# Patient Record
Sex: Male | Born: 1980 | Race: White | Hispanic: No | Marital: Single | Smoking: Never smoker
Health system: Southern US, Community
[De-identification: ages and names within clinical notes are randomized; demographics above are authoritative.]

## PROBLEM LIST (undated history)

## (undated) DIAGNOSIS — K219 Gastro-esophageal reflux disease without esophagitis: Secondary | ICD-10-CM

## (undated) DIAGNOSIS — R569 Unspecified convulsions: Secondary | ICD-10-CM

## (undated) DIAGNOSIS — IMO0001 Reserved for inherently not codable concepts without codable children: Secondary | ICD-10-CM

## (undated) DIAGNOSIS — M549 Dorsalgia, unspecified: Secondary | ICD-10-CM

## (undated) DIAGNOSIS — K8020 Calculus of gallbladder without cholecystitis without obstruction: Secondary | ICD-10-CM

## (undated) DIAGNOSIS — I10 Essential (primary) hypertension: Secondary | ICD-10-CM

## (undated) DIAGNOSIS — Z8489 Family history of other specified conditions: Secondary | ICD-10-CM

## (undated) HISTORY — PX: OTHER SURGICAL HISTORY: SHX169

---

## 1999-05-25 ENCOUNTER — Encounter: Admission: RE | Admit: 1999-05-25 | Discharge: 1999-05-25 | Payer: Self-pay | Admitting: Family Medicine

## 1999-05-25 ENCOUNTER — Emergency Department (HOSPITAL_COMMUNITY): Admission: EM | Admit: 1999-05-25 | Discharge: 1999-05-25 | Payer: Self-pay | Admitting: Emergency Medicine

## 1999-05-25 ENCOUNTER — Encounter: Payer: Self-pay | Admitting: Family Medicine

## 1999-05-26 ENCOUNTER — Encounter: Payer: Self-pay | Admitting: Emergency Medicine

## 2001-11-12 ENCOUNTER — Emergency Department (HOSPITAL_COMMUNITY): Admission: EM | Admit: 2001-11-12 | Discharge: 2001-11-12 | Payer: Self-pay | Admitting: Emergency Medicine

## 2001-11-12 ENCOUNTER — Encounter: Payer: Self-pay | Admitting: Emergency Medicine

## 2013-01-11 DIAGNOSIS — I10 Essential (primary) hypertension: Secondary | ICD-10-CM | POA: Diagnosis not present

## 2013-01-11 DIAGNOSIS — Z23 Encounter for immunization: Secondary | ICD-10-CM | POA: Diagnosis not present

## 2013-03-26 ENCOUNTER — Emergency Department (HOSPITAL_BASED_OUTPATIENT_CLINIC_OR_DEPARTMENT_OTHER)
Admission: EM | Admit: 2013-03-26 | Discharge: 2013-03-26 | Disposition: A | Discharge status: Home / Self Care | Payer: Medicare Other | Attending: Emergency Medicine | Admitting: Emergency Medicine

## 2013-03-26 ENCOUNTER — Encounter (HOSPITAL_BASED_OUTPATIENT_CLINIC_OR_DEPARTMENT_OTHER): Payer: Self-pay | Admitting: Emergency Medicine

## 2013-03-26 DIAGNOSIS — M545 Low back pain, unspecified: Secondary | ICD-10-CM | POA: Diagnosis not present

## 2013-03-26 DIAGNOSIS — M549 Dorsalgia, unspecified: Secondary | ICD-10-CM

## 2013-03-26 DIAGNOSIS — Z79899 Other long term (current) drug therapy: Secondary | ICD-10-CM | POA: Insufficient documentation

## 2013-03-26 DIAGNOSIS — I10 Essential (primary) hypertension: Secondary | ICD-10-CM | POA: Insufficient documentation

## 2013-03-26 HISTORY — DX: Essential (primary) hypertension: I10

## 2013-03-26 HISTORY — DX: Dorsalgia, unspecified: M54.9

## 2013-03-26 MED ORDER — CYCLOBENZAPRINE HCL 5 MG PO TABS: 5.0000 mg | ORAL_TABLET | Freq: Two times a day (BID) | ORAL | Status: DC | PRN

## 2013-03-26 MED ORDER — OXYCODONE-ACETAMINOPHEN 5-325 MG PO TABS: 1.0000 | ORAL_TABLET | ORAL | Status: DC | PRN

## 2013-03-26 NOTE — ED Notes (Addendum)
Bent over and picked up 35 lb dog last evening and felt pain in low mid back.  Worse this morning. One prior incident of similar back pain. Pt took Percocet 5/325 at 6:30 am that he got from a family member.

## 2013-03-26 NOTE — ED Provider Notes (Signed)
Medical screening examination/treatment/procedure(s) were performed by non-physician practitioner and as supervising physician I was immediately available for consultation/collaboration.  EKG Interpretation   None        Doug SouSam Kasheem Toner, MD 03/26/13 [phone]

## 2013-03-26 NOTE — Discharge Instructions (Signed)
Back Pain, Adult Low back pain is very common. About 1 in 5 people have back pain.The cause of low back pain is rarely dangerous. The pain often gets better over time.About half of people with a sudden onset of back pain feel better in just 2 weeks. About 8 in 10 people feel better by 6 weeks.  CAUSES Some common causes of back pain include:  Strain of the muscles or ligaments supporting the spine.  Wear and tear (degeneration) of the spinal discs.  Arthritis.  Direct injury to the back. DIAGNOSIS Most of the time, the direct cause of low back pain is not known.However, back pain can be treated effectively even when the exact cause of the pain is unknown.Answering your caregiver's questions about your overall health and symptoms is one of the most accurate ways to make sure the cause of your pain is not dangerous. If your caregiver needs more information, he or she may order lab work or imaging tests (X-rays or MRIs).However, even if imaging tests show changes in your back, this usually does not require surgery. HOME CARE INSTRUCTIONS For many people, back pain returns.Since low back pain is rarely dangerous, it is often a condition that people can learn to manageon their own.   Remain active. It is stressful on the back to sit or stand in one place. Do not sit, drive, or stand in one place for more than 30 minutes at a time. Take short walks on level surfaces as soon as pain allows.Try to increase the length of time you walk each day.  Do not stay in bed.Resting more than 1 or 2 days can delay your recovery.  Do not avoid exercise or work.Your body is made to move.It is not dangerous to be active, even though your back may hurt.Your back will likely heal faster if you return to being active before your pain is gone.  Pay attention to your body when you bend and lift. Many people have less discomfortwhen lifting if they bend their knees, keep the load close to their bodies,and  avoid twisting. Often, the most comfortable positions are those that put less stress on your recovering back.  Find a comfortable position to sleep. Use a firm mattress and lie on your side with your knees slightly bent. If you lie on your back, put a pillow under your knees.  Only take over-the-counter or prescription medicines as directed by your caregiver. Over-the-counter medicines to reduce pain and inflammation are often the most helpful.Your caregiver may prescribe muscle relaxant drugs.These medicines help dull your pain so you can more quickly return to your normal activities and healthy exercise.  Put ice on the injured area.  Put ice in a plastic bag.  Place a towel between your skin and the bag.  Leave the ice on for 15-20 minutes, 03-04 times a day for the first 2 to 3 days. After that, ice and heat may be alternated to reduce pain and spasms.  Ask your caregiver about trying back exercises and gentle massage. This may be of some benefit.  Avoid feeling anxious or stressed.Stress increases muscle tension and can worsen back pain.It is important to recognize when you are anxious or stressed and learn ways to manage it.Exercise is a great option. SEEK MEDICAL CARE IF:  You have pain that is not relieved with rest or medicine.  You have pain that does not improve in 1 week.  You have new symptoms.  You are generally not feeling well. SEEK   IMMEDIATE MEDICAL CARE IF:   You have pain that radiates from your back into your legs.  You develop new bowel or bladder control problems.  You have unusual weakness or numbness in your arms or legs.  You develop nausea or vomiting.  You develop abdominal pain.  You feel faint. Document Released: 02/22/2005 Document Revised: 08/24/2011 Document Reviewed: 07/13/2010 [employer] Patient Information 2014 [employer].  

## 2013-03-26 NOTE — ED Provider Notes (Signed)
CSN: [id number]     Arrival date & time 03/26/13  [id number] History   First MD Initiated Contact with Patient 03/26/13 [phone]     Chief Complaint  Patient presents with  . Back Pain   (Consider location/radiation/quality/duration/timing/severity/associated sxs/prior Treatment) HPI Comments: Pt states that he started having pain after lifting a 35 lb dog:pt states that he took some of his dad oxycodone and it helped:pt denies numbness, weakness or dysuria  Patient is a 33 y.o. male presenting with back pain. The history is provided by the patient. No language interpreter was used.  Back Pain Location:  Lumbar spine Quality:  Aching Radiates to:  Does not radiate Pain severity:  Mild Pain is:  Same all the time Onset quality:  Sudden Duration:  1 day Timing:  Constant Progression:  Unchanged Relieved by:  Nothing Worsened by:  Nothing tried   Past Medical History  Diagnosis Date  . Hypertension   . Back pain    History reviewed. No pertinent past surgical history. No family history on file. History  Substance Use Topics  . Smoking status: Never Smoker   . Smokeless tobacco: Never Used  . Alcohol Use: No    Review of Systems  Constitutional: Negative.   Respiratory: Negative.   Cardiovascular: Negative.   Musculoskeletal: Positive for back pain.    Allergies  Review of patient's allergies indicates no known allergies.  Home Medications   Current Outpatient Rx  Name  Route  Sig  Dispense  Refill  . lisinopril (PRINIVIL,ZESTRIL) 20 MG tablet   Oral   Take 20 mg by mouth daily.         . cyclobenzaprine (FLEXERIL) 5 MG tablet   Oral   Take 1 tablet (5 mg total) by mouth 2 (two) times daily as needed for muscle spasms.   20 tablet   0   . oxyCODONE-acetaminophen (PERCOCET/ROXICET) 5-325 MG per tablet   Oral   Take 1-2 tablets by mouth every 4 (four) hours as needed for severe pain.   15 tablet   0    BP 132/74  Pulse 78  Temp(Src) 98.3 F (36.8 C) (Oral)   Resp 16  Ht 5\' 11"  (1.803 m)  Wt 220 lb (99.791 kg)  BMI 30.70 kg/m2  SpO2 96% Physical Exam  Nursing note and vitals reviewed. Constitutional: He is oriented to person, place, and time. He appears well-developed and well-nourished.  Cardiovascular: Normal rate and regular rhythm.   Pulmonary/Chest: Effort normal and breath sounds normal.  Musculoskeletal: Normal range of motion.  Lumbar paraspinal tenderness  Neurological: He is alert and oriented to person, place, and time. He exhibits normal muscle tone. Coordination normal.  Skin: Skin is warm and dry.    ED Course  Procedures (including critical care time) Labs Review Labs Reviewed - No data to display Imaging Review No results found.  EKG Interpretation   None       MDM   1. Back pain    Pt not having any deficits:will treat symptomatically and have follow up with Dr. Pearletha Forgehudnall as needed   Teressa LowerVrinda Kaleia Longhi, NP 03/26/13 [phone]

## 2013-04-06 ENCOUNTER — Encounter (HOSPITAL_BASED_OUTPATIENT_CLINIC_OR_DEPARTMENT_OTHER): Payer: Self-pay | Admitting: Emergency Medicine

## 2013-04-06 ENCOUNTER — Emergency Department (HOSPITAL_BASED_OUTPATIENT_CLINIC_OR_DEPARTMENT_OTHER): Payer: Medicare Other

## 2013-04-06 ENCOUNTER — Emergency Department (HOSPITAL_BASED_OUTPATIENT_CLINIC_OR_DEPARTMENT_OTHER)
Admission: EM | Admit: 2013-04-06 | Discharge: 2013-04-06 | Discharge status: Against Medical Advice | Payer: Medicare Other | Attending: Emergency Medicine | Admitting: Emergency Medicine

## 2013-04-06 DIAGNOSIS — J029 Acute pharyngitis, unspecified: Secondary | ICD-10-CM | POA: Insufficient documentation

## 2013-04-06 DIAGNOSIS — R070 Pain in throat: Secondary | ICD-10-CM | POA: Diagnosis not present

## 2013-04-06 DIAGNOSIS — R1310 Dysphagia, unspecified: Secondary | ICD-10-CM | POA: Insufficient documentation

## 2013-04-06 DIAGNOSIS — Z79899 Other long term (current) drug therapy: Secondary | ICD-10-CM | POA: Diagnosis not present

## 2013-04-06 DIAGNOSIS — J019 Acute sinusitis, unspecified: Secondary | ICD-10-CM | POA: Diagnosis not present

## 2013-04-06 DIAGNOSIS — Z8739 Personal history of other diseases of the musculoskeletal system and connective tissue: Secondary | ICD-10-CM | POA: Diagnosis not present

## 2013-04-06 DIAGNOSIS — I10 Essential (primary) hypertension: Secondary | ICD-10-CM | POA: Diagnosis not present

## 2013-04-06 LAB — BASIC METABOLIC PANEL
BUN: 7 mg/dL (ref 6–23)
CHLORIDE: 101 meq/L (ref 96–112)
CO2: 24 mEq/L (ref 19–32)
Calcium: 9.5 mg/dL (ref 8.4–10.5)
Creatinine, Ser: 0.9 mg/dL (ref 0.50–1.35)
GFR calc Af Amer: >90 mL/min (ref >90)
GFR calc non Af Amer: >90 mL/min (ref >90)
Glucose, Bld: 101 mg/dL — ABNORMAL HIGH (ref 70–99)
POTASSIUM: 4.2 meq/L (ref 3.7–5.3)
Sodium: 139 mEq/L (ref 137–147)

## 2013-04-06 LAB — CBC WITH DIFFERENTIAL/PLATELET
Basophils Absolute: 0 10*3/uL (ref 0.0–0.1)
Basophils Relative: 1 % (ref 0–1)
Eosinophils Absolute: 0.2 10*3/uL (ref 0.0–0.7)
Eosinophils Relative: 3 % (ref 0–5)
HCT: 43.1 % (ref 39.0–52.0)
HEMOGLOBIN: 14.7 g/dL (ref 13.0–17.0)
LYMPHS PCT: 27 % (ref 12–46)
Lymphs Abs: 2 10*3/uL (ref 0.7–4.0)
MCH: 30.4 pg (ref 26.0–34.0)
MCHC: 34.1 g/dL (ref 30.0–36.0)
MCV: 89.2 fL (ref 78.0–100.0)
MONO ABS: 0.6 10*3/uL (ref 0.1–1.0)
MONOS PCT: 8 % (ref 3–12)
NEUTROS ABS: 4.6 10*3/uL (ref 1.7–7.7)
NEUTROS PCT: 62 % (ref 43–77)
Platelets: 244 10*3/uL (ref 150–400)
RBC: 4.83 MIL/uL (ref 4.22–5.81)
RDW: 12.8 % (ref 11.5–15.5)
WBC: 7.3 10*3/uL (ref 4.0–10.5)

## 2013-04-06 MED ORDER — SODIUM CHLORIDE 0.9 % IV BOLUS (SEPSIS)
250.0000 mL | Freq: Once | INTRAVENOUS | Status: AC
Start: 2013-04-06 — End: 2013-04-06
  Administered 2013-04-06: 250 mL via INTRAVENOUS

## 2013-04-06 MED ORDER — SODIUM CHLORIDE 0.9 % IV SOLN: INTRAVENOUS | Status: DC

## 2013-04-06 MED ORDER — ONDANSETRON HCL 4 MG/2ML IJ SOLN
4.0000 mg | Freq: Once | INTRAMUSCULAR | Status: AC
  Administered 2013-04-06: 4 mg via INTRAVENOUS
  Filled 2013-04-06: qty 2

## 2013-04-06 NOTE — ED Notes (Signed)
Pt sts he does not want the CT and requested that his IV be taken out so he can leave. Pt aware that tests were ordered to rule out condition that could cause deterioration. Pt sts he just wants med for reflux. Mother with pt. IV removed. EDP notified.

## 2013-04-06 NOTE — ED Provider Notes (Signed)
CSN: [id number]     Arrival date & time 04/06/13  7829 History   First MD Initiated Contact with Patient 04/06/13 [phone]     Chief Complaint  Patient presents with  . Throat issue    (Consider location/radiation/quality/duration/timing/severity/associated sxs/prior Treatment) The history is provided by the patient and a parent.   33 year old male with complaint of something catching in his throat for the past 6 days. Patient states it feels like something is catching when he lays down causes discomfort makes it a low hard to swallow feels as if he has strep throat without pain. Patient's voice has no changes able to swallow okay able to drink liquids and eat food fine. Patient did have a 3 times of vomiting a few days ago after the onset of this discomfort or sensation. No history of similar problem.  Past Medical History  Diagnosis Date  . Hypertension   . Back pain    History reviewed. No pertinent past surgical history. No family history on file. History  Substance Use Topics  . Smoking status: Never Smoker   . Smokeless tobacco: Never Used  . Alcohol Use: No    Review of Systems  Constitutional: Negative for fever.  HENT: Positive for sore throat and trouble swallowing. Negative for congestion, drooling, ear pain and voice change.   Eyes: Negative for redness.  Respiratory: Negative for shortness of breath.   Cardiovascular: Negative for chest pain.  Gastrointestinal: Negative for abdominal pain.  Genitourinary: Negative for hematuria.  Skin: Negative for rash.  Neurological: Negative for facial asymmetry, speech difficulty and headaches.  Psychiatric/Behavioral: Negative for confusion.    Allergies  Review of patient's allergies indicates no known allergies.  Home Medications   Current Outpatient Rx  Name  Route  Sig  Dispense  Refill  . cyclobenzaprine (FLEXERIL) 5 MG tablet   Oral   Take 1 tablet (5 mg total) by mouth 2 (two) times daily as needed for muscle  spasms.   20 tablet   0   . lisinopril (PRINIVIL,ZESTRIL) 20 MG tablet   Oral   Take 20 mg by mouth daily.         Marland Kitchen oxyCODONE-acetaminophen (PERCOCET/ROXICET) 5-325 MG per tablet   Oral   Take 1-2 tablets by mouth every 4 (four) hours as needed for severe pain.   15 tablet   0    BP 118/75  Pulse 78  Temp(Src) 97.9 F (36.6 C) (Oral)  Resp 16  Ht 5\' 11"  (1.803 m)  Wt 220 lb (99.791 kg)  BMI 30.70 kg/m2  SpO2 99% Physical Exam  Nursing note and vitals reviewed. Constitutional: He is oriented to person, place, and time. He appears well-developed and well-nourished. No distress.  HENT:  Head: Normocephalic and atraumatic.  Mouth/Throat: Oropharynx is clear and moist. No oropharyngeal exudate.  No erythema no uvula swelling no cervical adenopathy  Eyes: Conjunctivae and EOM are normal. Pupils are equal, round, and reactive to light.  Neck: Normal range of motion. Neck supple. No tracheal deviation present. No thyromegaly present.  Pulmonary/Chest: Effort normal and breath sounds normal. No stridor. No respiratory distress.  Abdominal: Soft. Bowel sounds are normal. There is no tenderness.  Lymphadenopathy:    He has no cervical adenopathy.  Neurological: He is alert and oriented to person, place, and time. No cranial nerve deficit. He exhibits normal muscle tone. Coordination normal.  Skin: No rash noted.    ED Course  Procedures (including critical care time) Labs Review Labs Reviewed  CBC WITH DIFFERENTIAL  BASIC METABOLIC PANEL   Imaging Review No results found.  EKG Interpretation   None       MDM   1. Pain in throat    Patient the exam that was fairly normal no foreign body sensation no oropharynx abnormalities no erythema no exudate no uvula swelling. Offered the patient the CT scan of a soft tissue neck with contrast to further evaluate his complaint of 6 days. Patient not able to tolerate the CT scan refused it when his IV out wants to go home. Labs  are not back yet patient be discharged AMA.    Shelda JakesScott W. Oleva Koo, MD 04/06/13 [phone]

## 2013-04-06 NOTE — ED Notes (Signed)
Pt c/o "feeling like a catch in my throat" and worse when laying down x 5-6 days. Pt denies pain, able to swallow. No swelling to tongue or throat. Pt reports similar issue in past with vomiting.

## 2013-04-06 NOTE — ED Notes (Signed)
Patient went to CT and repeatedly sat up while Tech was attempting to begin scan.  The third time the pt jumped off of the table and refused the scan.  Back into the room, the pt stated he does not want any tests, he only wants medication for reflux and nothing else.  Dr. Deretha EmoryZackowski aware.

## 2013-12-13 DIAGNOSIS — I10 Essential (primary) hypertension: Secondary | ICD-10-CM | POA: Diagnosis not present

## 2013-12-13 DIAGNOSIS — Z23 Encounter for immunization: Secondary | ICD-10-CM | POA: Diagnosis not present

## 2013-12-13 DIAGNOSIS — F609 Personality disorder, unspecified: Secondary | ICD-10-CM | POA: Diagnosis not present

## 2013-12-13 DIAGNOSIS — K219 Gastro-esophageal reflux disease without esophagitis: Secondary | ICD-10-CM | POA: Diagnosis not present

## 2014-03-23 ENCOUNTER — Encounter (HOSPITAL_BASED_OUTPATIENT_CLINIC_OR_DEPARTMENT_OTHER): Payer: Self-pay | Admitting: Emergency Medicine

## 2014-03-23 ENCOUNTER — Emergency Department (HOSPITAL_BASED_OUTPATIENT_CLINIC_OR_DEPARTMENT_OTHER): Payer: Medicare Other

## 2014-03-23 ENCOUNTER — Emergency Department (HOSPITAL_BASED_OUTPATIENT_CLINIC_OR_DEPARTMENT_OTHER)
Admission: EM | Admit: 2014-03-23 | Discharge: 2014-03-23 | Disposition: A | Discharge status: Home / Self Care | Payer: Medicare Other | Attending: Emergency Medicine | Admitting: Emergency Medicine

## 2014-03-23 DIAGNOSIS — R4702 Dysphasia: Secondary | ICD-10-CM | POA: Diagnosis not present

## 2014-03-23 DIAGNOSIS — Z79899 Other long term (current) drug therapy: Secondary | ICD-10-CM | POA: Diagnosis not present

## 2014-03-23 DIAGNOSIS — R1110 Vomiting, unspecified: Secondary | ICD-10-CM | POA: Insufficient documentation

## 2014-03-23 DIAGNOSIS — R1310 Dysphagia, unspecified: Secondary | ICD-10-CM | POA: Diagnosis not present

## 2014-03-23 DIAGNOSIS — I10 Essential (primary) hypertension: Secondary | ICD-10-CM | POA: Insufficient documentation

## 2014-03-23 LAB — CBC WITH DIFFERENTIAL/PLATELET
BASOS ABS: 0.1 10*3/uL (ref 0.0–0.1)
BASOS PCT: 1 % (ref 0–1)
EOS ABS: 0.2 10*3/uL (ref 0.0–0.7)
Eosinophils Relative: 2 % (ref 0–5)
HCT: 47.1 % (ref 39.0–52.0)
Hemoglobin: 16.1 g/dL (ref 13.0–17.0)
Lymphocytes Relative: 21 % (ref 12–46)
Lymphs Abs: 1.9 10*3/uL (ref 0.7–4.0)
MCH: 29.8 pg (ref 26.0–34.0)
MCHC: 34.2 g/dL (ref 30.0–36.0)
MCV: 87.1 fL (ref 78.0–100.0)
MONOS PCT: 6 % (ref 3–12)
Monocytes Absolute: 0.6 10*3/uL (ref 0.1–1.0)
NEUTROS ABS: 6.2 10*3/uL (ref 1.7–7.7)
NEUTROS PCT: 70 % (ref 43–77)
PLATELETS: 239 10*3/uL (ref 150–400)
RBC: 5.41 MIL/uL (ref 4.22–5.81)
RDW: 13.1 % (ref 11.5–15.5)
WBC: 8.9 10*3/uL (ref 4.0–10.5)

## 2014-03-23 LAB — COMPREHENSIVE METABOLIC PANEL
ALBUMIN: 5.2 g/dL (ref 3.5–5.2)
ALK PHOS: 73 U/L (ref 39–117)
ALT: 22 U/L (ref 0–53)
ANION GAP: 7 (ref 5–15)
AST: 23 U/L (ref 0–37)
BUN: 10 mg/dL (ref 6–23)
CO2: 26 mmol/L (ref 19–32)
CREATININE: 0.96 mg/dL (ref 0.50–1.35)
Calcium: 9.6 mg/dL (ref 8.4–10.5)
Chloride: 103 mEq/L (ref 96–112)
Glucose, Bld: 94 mg/dL (ref 70–99)
Potassium: 3.5 mmol/L (ref 3.5–5.1)
Sodium: 136 mmol/L (ref 135–145)
Total Bilirubin: 0.5 mg/dL (ref 0.3–1.2)
Total Protein: 8.8 g/dL — ABNORMAL HIGH (ref 6.0–8.3)

## 2014-03-23 LAB — LIPASE, BLOOD: LIPASE: 38 U/L (ref 11–59)

## 2014-03-23 MED ORDER — PANTOPRAZOLE SODIUM 40 MG IV SOLR
40.0000 mg | Freq: Once | INTRAVENOUS | Status: AC
  Administered 2014-03-23: 40 mg via INTRAVENOUS
  Filled 2014-03-23: qty 40

## 2014-03-23 MED ORDER — SODIUM CHLORIDE 0.9 % IV BOLUS (SEPSIS)
1000.0000 mL | Freq: Once | INTRAVENOUS | Status: AC
Start: 2014-03-23 — End: 2014-03-23
  Administered 2014-03-23: 1000 mL via INTRAVENOUS

## 2014-03-23 MED ORDER — ONDANSETRON HCL 4 MG/2ML IJ SOLN
4.0000 mg | Freq: Once | INTRAMUSCULAR | Status: AC
  Administered 2014-03-23: 4 mg via INTRAVENOUS
  Filled 2014-03-23: qty 2

## 2014-03-23 MED ORDER — SODIUM CHLORIDE 0.9 % IV SOLN: INTRAVENOUS | Status: DC

## 2014-03-23 NOTE — Discharge Instructions (Signed)
Give [facility] a call for follow-up the phone number is [phone]. Return for any new or worse symptoms or the inability for food to go down. Recommend the liquid diet for the next 24 hours. Continue your Prilosec.

## 2014-03-23 NOTE — ED Notes (Signed)
PT present to ED with complaints of unable to swallow since yesterday.

## 2014-03-23 NOTE — ED Provider Notes (Signed)
CSN: [id number]     Arrival date & time 03/23/14  [id number] History   This chart was scribed for Vanetta MuldersScott Madell Heino, MD by Evon Slackerrance Branch, ED Scribe. This patient was seen in room MH07/MH07 and the patient's care was started at 7:35 PM.      Chief Complaint  Patient presents with  . Dysphagia   The history is provided by the patient. No language interpreter was used.   HPI Comments: Neil Griffith is a 34 y.o. male who presents to the Emergency Department complaining of recurrent difficulty swallowing onset 1 day prior. Pt states that when he laid down her felt the food "coming back up." Pt states drinking solids or water the food "comes back up" within 30-40 minutes. Denies any pain other than discomfort in back of his throat. Pt states that about ever 10 minutes he has to spit out saliva. Pt describes his symptoms as mucous sitting in the back of his throat. Pt states previously when he had similar symptoms they lasted for about 2 days. Denies nausea.    PCP Dr. Nicholos Johnseade   Past Medical History  Diagnosis Date  . Hypertension   . Back pain    History reviewed. No pertinent past surgical history. No family history on file. History  Substance Use Topics  . Smoking status: Never Smoker   . Smokeless tobacco: Never Used  . Alcohol Use: No    Review of Systems  Constitutional: Negative for fever and chills.  HENT: Positive for trouble swallowing. Negative for rhinorrhea and sore throat.   Eyes: Negative for visual disturbance.  Respiratory: Negative for cough and shortness of breath.   Cardiovascular: Negative for chest pain and leg swelling.  Gastrointestinal: Positive for vomiting. Negative for nausea, abdominal pain and diarrhea.  Genitourinary: Negative for dysuria.  Musculoskeletal: Negative for back pain and neck pain.  Skin: Negative for rash.  Neurological: Negative for headaches.  Psychiatric/Behavioral: Negative for confusion.  All other systems reviewed and are  negative.     Allergies  Review of patient's allergies indicates no known allergies.  Home Medications   Prior to Admission medications   Medication Sig Start Date End Date Taking? Authorizing Provider  cyclobenzaprine (FLEXERIL) 5 MG tablet Take 1 tablet (5 mg total) by mouth 2 (two) times daily as needed for muscle spasms. 03/26/13   Teressa LowerVrinda Pickering, NP  lisinopril (PRINIVIL,ZESTRIL) 20 MG tablet Take 20 mg by mouth daily.    Historical Provider, MD  oxyCODONE-acetaminophen (PERCOCET/ROXICET) 5-325 MG per tablet Take 1-2 tablets by mouth every 4 (four) hours as needed for severe pain. 03/26/13   Teressa LowerVrinda Pickering, NP   BP 136/76 mmHg  Pulse 66  Temp(Src) 98.1 F (36.7 C) (Oral)  Resp 18  Ht 5\' 11"  (1.803 m)  Wt 220 lb (99.791 kg)  BMI 30.70 kg/m2  SpO2 99%   Physical Exam  Constitutional: He is oriented to person, place, and time. He appears well-developed and well-nourished. No distress.  HENT:  Head: Normocephalic and atraumatic.  Eyes: Conjunctivae and EOM are normal.  Neck: Neck supple. No tracheal deviation present.  Cardiovascular: Normal rate and regular rhythm.   Pulmonary/Chest: Effort normal and breath sounds normal. No respiratory distress. He has no wheezes. He has no rales.  Abdominal: Soft. Bowel sounds are normal. There is no tenderness.  Musculoskeletal: Normal range of motion. He exhibits no edema.  Neurological: He is alert and oriented to person, place, and time. No cranial nerve deficit.  Skin: Skin is warm and  dry.  Psychiatric: He has a normal mood and affect. His behavior is normal.  Nursing note and vitals reviewed.   ED Course  Procedures (including critical care time) DIAGNOSTIC STUDIES: Oxygen Saturation is 99% on RA, normal by my interpretation.    COORDINATION OF CARE: 7:49 PM-Discussed treatment plan with pt at bedside and pt agreed to plan.     Labs Review Labs Reviewed  COMPREHENSIVE METABOLIC PANEL - Abnormal; Notable for the  following:    Total Protein 8.8 (*)    All other components within normal limits  CBC WITH DIFFERENTIAL  LIPASE, BLOOD   Results for orders placed or performed during the hospital encounter of 03/23/14  CBC with Differential  Result Value Ref Range   WBC 8.9 4.0 - 10.5 K/uL   RBC 5.41 4.22 - 5.81 MIL/uL   Hemoglobin 16.1 13.0 - 17.0 g/dL   HCT 16.1 09.6 - 04.5 %   MCV 87.1 78.0 - 100.0 fL   MCH 29.8 26.0 - 34.0 pg   MCHC 34.2 30.0 - 36.0 g/dL   RDW 40.9 81.1 - 91.4 %   Platelets 239 150 - 400 K/uL   Neutrophils Relative % 70 43 - 77 %   Neutro Abs 6.2 1.7 - 7.7 K/uL   Lymphocytes Relative 21 12 - 46 %   Lymphs Abs 1.9 0.7 - 4.0 K/uL   Monocytes Relative 6 3 - 12 %   Monocytes Absolute 0.6 0.1 - 1.0 K/uL   Eosinophils Relative 2 0 - 5 %   Eosinophils Absolute 0.2 0.0 - 0.7 K/uL   Basophils Relative 1 0 - 1 %   Basophils Absolute 0.1 0.0 - 0.1 K/uL  Comprehensive metabolic panel  Result Value Ref Range   Sodium 136 135 - 145 mmol/L   Potassium 3.5 3.5 - 5.1 mmol/L   Chloride 103 96 - 112 mEq/L   CO2 26 19 - 32 mmol/L   Glucose, Bld 94 70 - 99 mg/dL   BUN 10 6 - 23 mg/dL   Creatinine, Ser 7.82 0.50 - 1.35 mg/dL   Calcium 9.6 8.4 - 95.6 mg/dL   Total Protein 8.8 (H) 6.0 - 8.3 g/dL   Albumin 5.2 3.5 - 5.2 g/dL   AST 23 0 - 37 U/L   ALT 22 0 - 53 U/L   Alkaline Phosphatase 73 39 - 117 U/L   Total Bilirubin 0.5 0.3 - 1.2 mg/dL   GFR calc non Af Amer >90 >90 mL/min   GFR calc Af Amer >90 >90 mL/min   Anion gap 7 5 - 15  Lipase, blood  Result Value Ref Range   Lipase 38 11 - 59 U/L     Imaging Review Dg Chest 2 View  03/23/2014   CLINICAL DATA:  Difficulty swallowing beginning last yesterday.  EXAM: CHEST  2 VIEW  COMPARISON:  None.  FINDINGS: The heart size and mediastinal contours are within normal limits. Both lungs are clear. The visualized skeletal structures are unremarkable.  IMPRESSION: No active cardiopulmonary disease.   Electronically Signed   By: Charlett Nose  M.D.   On: 03/23/2014 20:44     EKG Interpretation None      MDM   Final diagnoses:  Dysphagia   Patient symptoms seem to be more consistent with perhaps the esophageal motility or gastric motility problem. No evidence of true food impaction. Patient able to handle liquids. Occasionally some will come back but not all. Patient without really getting food stuck. But seems  to regurgitate after many hours. Most likely needs upper endoscopy by GI. Referral to [facility] provided. Also patient has equal primary care to follow-up with. Patient is already on Prilosec recommend that they continue that. Chest x-ray negative for any significant findings. Ever function test are normal lipase is normal. No significant lab abnormalities.   I personally performed the services described in this documentation, which was scribed in my presence. The recorded information has been reviewed and is accurate.       Vanetta Mulders, MD 03/23/14 2225

## 2014-03-25 DIAGNOSIS — K219 Gastro-esophageal reflux disease without esophagitis: Secondary | ICD-10-CM | POA: Diagnosis not present

## 2014-06-15 ENCOUNTER — Encounter (HOSPITAL_COMMUNITY): Payer: Self-pay | Admitting: Emergency Medicine

## 2014-06-15 ENCOUNTER — Emergency Department (HOSPITAL_COMMUNITY)
Admission: EM | Admit: 2014-06-15 | Discharge: 2014-06-15 | Disposition: A | Discharge status: Home / Self Care | Payer: Medicare Other | Attending: Emergency Medicine | Admitting: Emergency Medicine

## 2014-06-15 ENCOUNTER — Emergency Department (HOSPITAL_COMMUNITY): Payer: Medicare Other

## 2014-06-15 DIAGNOSIS — Z79899 Other long term (current) drug therapy: Secondary | ICD-10-CM | POA: Diagnosis not present

## 2014-06-15 DIAGNOSIS — R1031 Right lower quadrant pain: Secondary | ICD-10-CM | POA: Diagnosis not present

## 2014-06-15 DIAGNOSIS — K2970 Gastritis, unspecified, without bleeding: Secondary | ICD-10-CM | POA: Diagnosis not present

## 2014-06-15 DIAGNOSIS — I10 Essential (primary) hypertension: Secondary | ICD-10-CM | POA: Diagnosis not present

## 2014-06-15 DIAGNOSIS — N2889 Other specified disorders of kidney and ureter: Secondary | ICD-10-CM | POA: Diagnosis not present

## 2014-06-15 DIAGNOSIS — R10811 Right upper quadrant abdominal tenderness: Secondary | ICD-10-CM | POA: Diagnosis not present

## 2014-06-15 LAB — COMPREHENSIVE METABOLIC PANEL
ALBUMIN: 4.5 g/dL (ref 3.5–5.2)
ALT: 28 U/L (ref 0–53)
AST: 44 U/L — AB (ref 0–37)
Alkaline Phosphatase: 69 U/L (ref 39–117)
Anion gap: 9 (ref 5–15)
BUN: 12 mg/dL (ref 6–23)
CALCIUM: 9.2 mg/dL (ref 8.4–10.5)
CO2: 24 mmol/L (ref 19–32)
Chloride: 99 mmol/L (ref 96–112)
Creatinine, Ser: 1.03 mg/dL (ref 0.50–1.35)
GFR calc non Af Amer: >90 mL/min (ref >90)
GLUCOSE: 113 mg/dL — AB (ref 70–99)
Potassium: 3.7 mmol/L (ref 3.5–5.1)
Sodium: 132 mmol/L — ABNORMAL LOW (ref 135–145)
TOTAL PROTEIN: 7.6 g/dL (ref 6.0–8.3)
Total Bilirubin: 0.8 mg/dL (ref 0.3–1.2)

## 2014-06-15 LAB — CBC WITH DIFFERENTIAL/PLATELET
Basophils Absolute: 0.1 10*3/uL (ref 0.0–0.1)
Basophils Relative: 1 % (ref 0–1)
EOS ABS: 0.2 10*3/uL (ref 0.0–0.7)
Eosinophils Relative: 2 % (ref 0–5)
HCT: 44.9 % (ref 39.0–52.0)
Hemoglobin: 15.1 g/dL (ref 13.0–17.0)
Lymphocytes Relative: 16 % (ref 12–46)
Lymphs Abs: 1.7 10*3/uL (ref 0.7–4.0)
MCH: 30.2 pg (ref 26.0–34.0)
MCHC: 33.6 g/dL (ref 30.0–36.0)
MCV: 89.8 fL (ref 78.0–100.0)
MONOS PCT: 6 % (ref 3–12)
Monocytes Absolute: 0.6 10*3/uL (ref 0.1–1.0)
Neutro Abs: 8.2 10*3/uL — ABNORMAL HIGH (ref 1.7–7.7)
Neutrophils Relative %: 75 % (ref 43–77)
Platelets: 222 10*3/uL (ref 150–400)
RBC: 5 MIL/uL (ref 4.22–5.81)
RDW: 13.5 % (ref 11.5–15.5)
WBC: 10.8 10*3/uL — AB (ref 4.0–10.5)

## 2014-06-15 LAB — URINALYSIS, ROUTINE W REFLEX MICROSCOPIC
Bilirubin Urine: NEGATIVE
GLUCOSE, UA: NEGATIVE mg/dL
HGB URINE DIPSTICK: NEGATIVE
KETONES UR: NEGATIVE mg/dL
LEUKOCYTES UA: NEGATIVE
Nitrite: NEGATIVE
PROTEIN: NEGATIVE mg/dL
Specific Gravity, Urine: 1.024 (ref 1.005–1.030)
UROBILINOGEN UA: 0.2 mg/dL (ref 0.0–1.0)
pH: 5.5 (ref 5.0–8.0)

## 2014-06-15 LAB — LIPASE, BLOOD: LIPASE: 35 U/L (ref 11–59)

## 2014-06-15 MED ORDER — IOHEXOL 300 MG/ML  SOLN
100.0000 mL | Freq: Once | INTRAMUSCULAR | Status: AC | PRN
  Administered 2014-06-15: 100 mL via INTRAVENOUS

## 2014-06-15 MED ORDER — IOHEXOL 300 MG/ML  SOLN
50.0000 mL | Freq: Once | INTRAMUSCULAR | Status: AC | PRN
  Administered 2014-06-15: 50 mL via ORAL

## 2014-06-15 NOTE — ED Notes (Signed)
Bed: [id number] Expected date:  Expected time:  Means of arrival:  Comments: EMS 48M RUQ abd pain

## 2014-06-15 NOTE — ED Notes (Signed)
Pt awoke with RUQ abdominal pain.  Pt states it began an hour ago.  Pt has no rebound pain but does possess his appendix.  Pt states pain is sharp in nature.

## 2014-06-15 NOTE — ED Provider Notes (Signed)
CSN: [id number]     Arrival date & time 06/15/14  0609 History   First MD Initiated Contact with Patient 06/15/14 0701     Chief Complaint  Patient presents with  . Abdominal Pain     (Consider location/radiation/quality/duration/timing/severity/associated sxs/prior Treatment) HPI Comments: Patient is a 34 year old male with history of hypertension and acid reflux, however no prior abdominal surgeries who presents with complaints of pain in his right mid abdomen which started yesterday evening. He denies any fevers or chills. He denies any diarrhea or constipation. He denies any urinary complaints.  Patient is a 34 y.o. male presenting with abdominal pain. The history is provided by the patient.  Abdominal Pain Pain location:  RLQ Pain quality: cramping   Pain radiates to:  Does not radiate Pain severity:  Moderate Onset quality:  Sudden Duration:  12 hours Timing:  Constant Progression:  Worsening Chronicity:  New Relieved by:  Nothing Worsened by:  Nothing tried Ineffective treatments:  None tried   Past Medical History  Diagnosis Date  . Hypertension   . Back pain    History reviewed. No pertinent past surgical history. History reviewed. No pertinent family history. History  Substance Use Topics  . Smoking status: Never Smoker   . Smokeless tobacco: Never Used  . Alcohol Use: No    Review of Systems  Gastrointestinal: Positive for abdominal pain.  All other systems reviewed and are negative.     Allergies  Review of patient's allergies indicates no known allergies.  Home Medications   Prior to Admission medications   Medication Sig Start Date End Date Taking? Authorizing Provider  lisinopril (PRINIVIL,ZESTRIL) 20 MG tablet Take 20 mg by mouth daily.   Yes Historical Provider, MD  omeprazole (PRILOSEC) 40 MG capsule Take 40 mg by mouth daily.   Yes Historical Provider, MD  cyclobenzaprine (FLEXERIL) 5 MG tablet Take 1 tablet (5 mg total) by mouth 2 (two)  times daily as needed for muscle spasms. Patient not taking: Reported on 06/15/2014 03/26/13   Teressa Lower, NP  oxyCODONE-acetaminophen (PERCOCET/ROXICET) 5-325 MG per tablet Take 1-2 tablets by mouth every 4 (four) hours as needed for severe pain. Patient not taking: Reported on 06/15/2014 03/26/13   Teressa Lower, NP   BP 120/65 mmHg  Pulse 66  Temp(Src) 98.1 F (36.7 C) (Oral)  Resp 18  Ht  (1.803 m)  Wt 205 lb (92.987 kg)  BMI 28.60 kg/m2  SpO2 98% Physical Exam  Constitutional: He is oriented to person, place, and time. He appears well-developed and well-nourished. No distress.  HENT:  Head: Normocephalic and atraumatic.  Neck: Normal range of motion. Neck supple.  Cardiovascular: Normal rate, regular rhythm and normal heart sounds.   No murmur heard. Pulmonary/Chest: Effort normal and breath sounds normal. No respiratory distress. He has no wheezes.  Abdominal: Soft. Bowel sounds are normal. He exhibits no distension. There is tenderness. There is no rebound and no guarding.  There is tenderness to palpation in the right lower quadrant  Musculoskeletal: Normal range of motion. He exhibits no edema.  Lymphadenopathy:    He has no cervical adenopathy.  Neurological: He is alert and oriented to person, place, and time.  Skin: Skin is warm and dry. He is not diaphoretic.  Nursing note and vitals reviewed.   ED Course  Procedures (including critical care time) Labs Review Labs Reviewed  COMPREHENSIVE METABOLIC PANEL  LIPASE, BLOOD  CBC WITH DIFFERENTIAL/PLATELET  URINALYSIS, ROUTINE W REFLEX MICROSCOPIC    Imaging Review  No results found.   EKG Interpretation None      MDM   Final diagnoses:  None    Workup reveals a slight white count, however CT scan does not suggest appendicitis. There is the finding of a dilated right intrarenal collecting system consistent with a recently passed stone. As the patient's pain resolved spontaneously while in the  emergency department, I suspect that this is the case. He will be discharged to home with instructions to return as needed for any problems.    Geoffery Lyonsouglas Gershom Brobeck, MD 06/15/14 [phone]

## 2014-06-15 NOTE — ED Notes (Signed)
Pt transported from home by EMS with c/o RUQ pain onset @0500 , denies n/v/d.

## 2014-06-15 NOTE — ED Notes (Signed)
Pt transported to CT ?

## 2014-06-15 NOTE — Discharge Instructions (Signed)
Follow-up with your primary Dr. if not improving in the next week, and return to the ER if your symptoms significantly worsen or change.   Abdominal Pain Many things can cause abdominal pain. Usually, abdominal pain is not caused by a disease and will improve without treatment. It can often be observed and treated at home. Your health care provider will do a physical exam and possibly order blood tests and X-rays to help determine the seriousness of your pain. However, in many cases, more time must pass before a clear cause of the pain can be found. Before that point, your health care provider may not know if you need more testing or further treatment. HOME CARE INSTRUCTIONS  Monitor your abdominal pain for any changes. The following actions may help to alleviate any discomfort you are experiencing:  Only take over-the-counter or prescription medicines as directed by your health care provider.  Do not take laxatives unless directed to do so by your health care provider.  Try a clear liquid diet (broth, tea, or water) as directed by your health care provider. Slowly move to a bland diet as tolerated. SEEK MEDICAL CARE IF:  You have unexplained abdominal pain.  You have abdominal pain associated with nausea or diarrhea.  You have pain when you urinate or have a bowel movement.  You experience abdominal pain that wakes you in the night.  You have abdominal pain that is worsened or improved by eating food.  You have abdominal pain that is worsened with eating fatty foods.  You have a fever. SEEK IMMEDIATE MEDICAL CARE IF:   Your pain does not go away within 2 hours.  You keep throwing up (vomiting).  Your pain is felt only in portions of the abdomen, such as the right side or the left lower portion of the abdomen.  You pass bloody or black tarry stools. MAKE SURE YOU:  Understand these instructions.   Will watch your condition.   Will get help right away if you are not doing  well or get worse.  Document Released: 12/02/2004 Document Revised: 02/27/2013 Document Reviewed: 11/01/2012 [place] Patient Information 2015 [place]. This information is not intended to replace advice given to you by your health care provider. Make sure you discuss any questions you have with your health care provider.

## 2014-07-11 DIAGNOSIS — Z0000 Encounter for general adult medical examination without abnormal findings: Secondary | ICD-10-CM | POA: Diagnosis not present

## 2014-07-11 DIAGNOSIS — K219 Gastro-esophageal reflux disease without esophagitis: Secondary | ICD-10-CM | POA: Diagnosis not present

## 2014-07-11 DIAGNOSIS — Z1389 Encounter for screening for other disorder: Secondary | ICD-10-CM | POA: Diagnosis not present

## 2014-07-11 DIAGNOSIS — E782 Mixed hyperlipidemia: Secondary | ICD-10-CM | POA: Diagnosis not present

## 2014-07-11 DIAGNOSIS — I10 Essential (primary) hypertension: Secondary | ICD-10-CM | POA: Diagnosis not present

## 2015-01-29 ENCOUNTER — Emergency Department (HOSPITAL_BASED_OUTPATIENT_CLINIC_OR_DEPARTMENT_OTHER)
Admission: EM | Admit: 2015-01-29 | Discharge: 2015-01-29 | Disposition: A | Discharge status: Home / Self Care | Payer: Medicare Other | Attending: Physician Assistant | Admitting: Physician Assistant

## 2015-01-29 ENCOUNTER — Emergency Department (HOSPITAL_BASED_OUTPATIENT_CLINIC_OR_DEPARTMENT_OTHER): Payer: Medicare Other

## 2015-01-29 ENCOUNTER — Encounter (HOSPITAL_BASED_OUTPATIENT_CLINIC_OR_DEPARTMENT_OTHER): Payer: Self-pay | Admitting: *Deleted

## 2015-01-29 DIAGNOSIS — K219 Gastro-esophageal reflux disease without esophagitis: Secondary | ICD-10-CM | POA: Diagnosis not present

## 2015-01-29 DIAGNOSIS — I10 Essential (primary) hypertension: Secondary | ICD-10-CM | POA: Insufficient documentation

## 2015-01-29 DIAGNOSIS — Z79899 Other long term (current) drug therapy: Secondary | ICD-10-CM | POA: Diagnosis not present

## 2015-01-29 DIAGNOSIS — K8020 Calculus of gallbladder without cholecystitis without obstruction: Secondary | ICD-10-CM | POA: Insufficient documentation

## 2015-01-29 DIAGNOSIS — R1011 Right upper quadrant pain: Secondary | ICD-10-CM

## 2015-01-29 HISTORY — DX: Gastro-esophageal reflux disease without esophagitis: K21.9

## 2015-01-29 HISTORY — DX: Reserved for inherently not codable concepts without codable children: IMO0001

## 2015-01-29 LAB — CBC WITH DIFFERENTIAL/PLATELET
BASOS ABS: 0.1 10*3/uL (ref 0.0–0.1)
Basophils Relative: 1 %
Eosinophils Absolute: 0.2 10*3/uL (ref 0.0–0.7)
Eosinophils Relative: 2 %
HCT: 43.3 % (ref 39.0–52.0)
HEMOGLOBIN: 14.8 g/dL (ref 13.0–17.0)
LYMPHS ABS: 1.5 10*3/uL (ref 0.7–4.0)
LYMPHS PCT: 17 %
MCH: 29.5 pg (ref 26.0–34.0)
MCHC: 34.2 g/dL (ref 30.0–36.0)
MCV: 86.4 fL (ref 78.0–100.0)
Monocytes Absolute: 0.5 10*3/uL (ref 0.1–1.0)
Monocytes Relative: 6 %
NEUTROS ABS: 6.2 10*3/uL (ref 1.7–7.7)
NEUTROS PCT: 74 %
Platelets: 237 10*3/uL (ref 150–400)
RBC: 5.01 MIL/uL (ref 4.22–5.81)
RDW: 12.9 % (ref 11.5–15.5)
WBC: 8.5 10*3/uL (ref 4.0–10.5)

## 2015-01-29 LAB — COMPREHENSIVE METABOLIC PANEL
ALT: 19 U/L (ref 17–63)
ANION GAP: 6 (ref 5–15)
AST: 18 U/L (ref 15–41)
Albumin: 4.7 g/dL (ref 3.5–5.0)
Alkaline Phosphatase: 65 U/L (ref 38–126)
BILIRUBIN TOTAL: 0.6 mg/dL (ref 0.3–1.2)
BUN: 11 mg/dL (ref 6–20)
CHLORIDE: 101 mmol/L (ref 101–111)
CO2: 29 mmol/L (ref 22–32)
Calcium: 9.8 mg/dL (ref 8.9–10.3)
Creatinine, Ser: 0.98 mg/dL (ref 0.61–1.24)
Glucose, Bld: 117 mg/dL — ABNORMAL HIGH (ref 65–99)
POTASSIUM: 3.9 mmol/L (ref 3.5–5.1)
Sodium: 136 mmol/L (ref 135–145)
TOTAL PROTEIN: 8 g/dL (ref 6.5–8.1)

## 2015-01-29 LAB — URINALYSIS, ROUTINE W REFLEX MICROSCOPIC
BILIRUBIN URINE: NEGATIVE
GLUCOSE, UA: NEGATIVE mg/dL
Hgb urine dipstick: NEGATIVE
KETONES UR: NEGATIVE mg/dL
LEUKOCYTES UA: NEGATIVE
Nitrite: NEGATIVE
PROTEIN: NEGATIVE mg/dL
Specific Gravity, Urine: 1.023 (ref 1.005–1.030)
pH: 5.5 (ref 5.0–8.0)

## 2015-01-29 LAB — LIPASE, BLOOD: Lipase: 40 U/L (ref 11–51)

## 2015-01-29 MED ORDER — OXYCODONE-ACETAMINOPHEN 5-325 MG PO TABS: 1.0000 | ORAL_TABLET | Freq: Four times a day (QID) | ORAL | Status: DC | PRN

## 2015-01-29 MED ORDER — SODIUM CHLORIDE 0.9 % IV BOLUS (SEPSIS)
1000.0000 mL | Freq: Once | INTRAVENOUS | Status: AC
  Administered 2015-01-29: 1000 mL via INTRAVENOUS

## 2015-01-29 MED ORDER — OXYCODONE-ACETAMINOPHEN 5-325 MG PO TABS
1.0000 | ORAL_TABLET | Freq: Once | ORAL | Status: AC
  Administered 2015-01-29: 1 via ORAL
  Filled 2015-01-29: qty 1

## 2015-01-29 MED ORDER — ONDANSETRON HCL 4 MG PO TABS: 4.0000 mg | ORAL_TABLET | Freq: Three times a day (TID) | ORAL | Status: DC | PRN

## 2015-01-29 MED ORDER — IBUPROFEN 800 MG PO TABS: 800.0000 mg | ORAL_TABLET | Freq: Three times a day (TID) | ORAL | Status: DC

## 2015-01-29 NOTE — ED Provider Notes (Addendum)
CSN: [id number]     Arrival date & time 01/29/15  0715 History   First MD Initiated Contact with Patient 01/29/15 0720     Chief Complaint  Patient presents with  . Abdominal Pain     (Consider location/radiation/quality/duration/timing/severity/associated sxs/prior Treatment) HPI patient is a 34 year old male with history of hypertension and reflux presenting today with right upper quadrant pain. Says it woke him up at 3:30 in the morning. Denies any nausea vomiting or diarrhea. Denies any fever. Has never had pain like this in the past. He says his both colicky and stays constant. There is no lower abdominal component to this. Patient did not eat anything different than usual. Patient is accompanied by his mother.  Past Medical History  Diagnosis Date  . Hypertension   . Reflux    History reviewed. No pertinent past surgical history. History reviewed. No pertinent family history. Social History  Substance Use Topics  . Smoking status: Never Smoker   . Smokeless tobacco: None  . Alcohol Use: None    Review of Systems  Constitutional: Negative for activity change.  HENT: Negative for congestion.   Respiratory: Negative for shortness of breath.   Cardiovascular: Negative for chest pain.  Gastrointestinal: Positive for abdominal pain. Negative for nausea, diarrhea and constipation.  Genitourinary: Negative for dysuria and difficulty urinating.  Psychiatric/Behavioral: Negative for agitation.      Allergies  Review of patient's allergies indicates no known allergies.  Home Medications   Prior to Admission medications   Medication Sig Start Date End Date Taking? Authorizing Provider  lisinopril (PRINIVIL,ZESTRIL) 20 MG tablet Take 20 mg by mouth daily.   Yes Historical Provider, MD  omeprazole (PRILOSEC) 20 MG capsule Take 20 mg by mouth daily.   Yes Historical Provider, MD   BP 124/79 mmHg  Pulse 74  Temp(Src) 98 F (36.7 C) (Oral)  Resp 16  Ht 6' (1.829 m)  Wt 205  lb (92.987 kg)  BMI 27.80 kg/m2  SpO2 100% Physical Exam  Constitutional: He is oriented to person, place, and time. He appears well-nourished.  HENT:  Head: Normocephalic.  Mouth/Throat: Oropharynx is clear and moist.  Eyes: Conjunctivae are normal.  Neck: No tracheal deviation present.  Cardiovascular: Normal rate.   Pulmonary/Chest: Effort normal. No stridor. No respiratory distress.  Abdominal: Soft. There is tenderness. There is no guarding.  Mild tenderness right upper quadrant.  Musculoskeletal: Normal range of motion. He exhibits no edema.  Neurological: He is oriented to person, place, and time. No cranial nerve deficit.  Skin: Skin is warm and dry. No rash noted. He is not diaphoretic.  Psychiatric: He has a normal mood and affect. His behavior is normal.  Nursing note and vitals reviewed.   ED Course  Procedures (including critical care time) Labs Review Labs Reviewed  COMPREHENSIVE METABOLIC PANEL - Abnormal; Notable for the following:    Glucose, Bld 117 (*)    All other components within normal limits  URINALYSIS, ROUTINE W REFLEX MICROSCOPIC (NOT AT [facility])  CBC WITH DIFFERENTIAL/PLATELET  LIPASE, BLOOD    Imaging Review [place] Abdomen Complete  01/29/2015  CLINICAL DATA:  Right upper quadrant pain radiating to back this morning. EXAM: ULTRASOUND ABDOMEN COMPLETE COMPARISON:  None. FINDINGS: Gallbladder: Numerous mobile gallstones within the gallbladder. No wall thickening. Negative sonographic Murphy's. Common bile duct: Diameter: Normal caliber proximally, 3 mm. Liver: No focal lesion identified. Within normal limits in parenchymal echogenicity. IVC: No abnormality visualized. Pancreas: Visualized portion unremarkable. Spleen: Size and appearance within normal  limits. Right Kidney: Length: 11.8 cm. Echogenicity within normal limits. No mass or hydronephrosis visualized. Left Kidney: Length: 11.2 cm. Echogenicity within normal limits. No mass or hydronephrosis  visualized. Abdominal aorta: No aneurysm visualized. Other findings: None. IMPRESSION: Cholelithiasis.  No sonographic evidence of acute cholecystitis. Electronically Signed   By: Charlett Nose M.D.   On: 01/29/2015 09:12   I have personally reviewed and evaluated these images and lab results as part of my medical decision-making.   EKG Interpretation None      MDM   Final diagnoses:  Right upper quadrant pain  Patietn is a previously healthy 34 year old male presenting with right upper quadrant pain. This started around 3:30 the morning. Patient has not had any nausea vomiting diarrhea. Most likely etiology is gallstones. We will do an ultrasound. Doubt infection given his normal vital signs, no fever, relatively benign exam.  We'll get labs to rule out pancreatitis, acute hepatitis, make sure that there is no signs of infection.    9:20 AM Patient has gallstones. No signs of infection. We'll give him pain control and nausea control and have him follow-up with a surgeon as an outpatient. He has had no vomiting, does not appear in acute distress and has very minimal pain.  Serinity Ware Randall An, MD 01/29/15 0920  Georgiann Neider Randall An, MD 01/29/15 [id number]

## 2015-01-29 NOTE — ED Notes (Signed)
MD at bedside. 

## 2015-01-29 NOTE — ED Notes (Signed)
Patient returns from Ultrasound. 

## 2015-01-29 NOTE — Discharge Instructions (Signed)
You were seen and you have gallstones today. There is no surrounding infection at this time. However if you develop a fever or increasing pain you should come back because you may need your gallbladder taken out immediately.  Currently, it is your safe to manage her symptoms at home and follow-up with a surgeon as an outpatient.   Biliary Colic Biliary colic is a pain in the upper abdomen. The pain:  Is usually felt on the right side of the abdomen, but it may also be felt in the center of the abdomen, just below the breastbone (sternum).  May spread back toward the right shoulder blade.  May be steady or irregular.  May be accompanied by nausea and vomiting. Most of the time, the pain goes away in 1-5 hours. After the most intense pain passes, the abdomen may continue to ache mildly for about 24 hours. Biliary colic is caused by a blockage in the bile duct. The bile duct is a pathway that carries bile--a liquid that helps to digest fats--from the gallbladder to the small intestine. Biliary colic usually occurs after eating, when the digestive system demands bile. The pain develops when muscle cells contract forcefully to try to move the blockage so that bile can get by. HOME CARE INSTRUCTIONS  Take medicines only as directed by your health care provider.  Drink enough fluid to keep your urine clear or pale yellow.  Avoid fatty, greasy, and fried foods. These kinds of foods increase your body's demand for bile.  Avoid any foods that make your pain worse.  Avoid overeating.  Avoid having a large meal after fasting. SEEK MEDICAL CARE IF:  You develop a fever.  Your pain gets worse.  You vomit.  You develop nausea that prevents you from eating and drinking. SEEK IMMEDIATE MEDICAL CARE IF:  You suddenly develop a fever and shaking chills.  You develop a yellowish discoloration (jaundice) of:  Skin.  Whites of the eyes.  Mucous membranes.  You have continuous or severe pain  that is not relieved with medicines.  You have nausea and vomiting that is not relieved with medicines.  You develop dizziness or you faint.   This information is not intended to replace advice given to you by your health care provider. Make sure you discuss any questions you have with your health care provider.   Document Released: 07/26/2005 Document Revised: 07/09/2014 Document Reviewed: 12/04/2013 Elsevier Interactive Patient Education [employer] Elsevier Inc.

## 2015-01-29 NOTE — ED Notes (Signed)
Patient transported to Ultrasound 

## 2015-01-29 NOTE — ED Notes (Signed)
Pt reports sudden onset of rlq pain radiating to right flank, onset at 3am, "it woke me up." per pt, pain waxes and wanes, but is constant. Denies any n/v/d or urinary symptoms.

## 2015-02-19 DIAGNOSIS — R1011 Right upper quadrant pain: Secondary | ICD-10-CM | POA: Diagnosis not present

## 2015-02-19 DIAGNOSIS — K8020 Calculus of gallbladder without cholecystitis without obstruction: Secondary | ICD-10-CM | POA: Diagnosis not present

## 2015-03-01 ENCOUNTER — Emergency Department (HOSPITAL_COMMUNITY): Payer: Medicare Other

## 2015-03-01 ENCOUNTER — Encounter (HOSPITAL_COMMUNITY): Payer: Self-pay

## 2015-03-01 ENCOUNTER — Emergency Department (HOSPITAL_COMMUNITY)
Admission: EM | Admit: 2015-03-01 | Discharge: 2015-03-01 | Disposition: A | Discharge status: Home / Self Care | Payer: Medicare Other | Attending: Emergency Medicine | Admitting: Emergency Medicine

## 2015-03-01 DIAGNOSIS — Z8719 Personal history of other diseases of the digestive system: Secondary | ICD-10-CM | POA: Diagnosis not present

## 2015-03-01 DIAGNOSIS — M549 Dorsalgia, unspecified: Secondary | ICD-10-CM | POA: Diagnosis not present

## 2015-03-01 DIAGNOSIS — K8020 Calculus of gallbladder without cholecystitis without obstruction: Secondary | ICD-10-CM | POA: Diagnosis not present

## 2015-03-01 DIAGNOSIS — R109 Unspecified abdominal pain: Secondary | ICD-10-CM | POA: Diagnosis present

## 2015-03-01 DIAGNOSIS — R1031 Right lower quadrant pain: Secondary | ICD-10-CM | POA: Diagnosis not present

## 2015-03-01 DIAGNOSIS — R1084 Generalized abdominal pain: Secondary | ICD-10-CM | POA: Diagnosis not present

## 2015-03-01 DIAGNOSIS — Z87442 Personal history of urinary calculi: Secondary | ICD-10-CM

## 2015-03-01 DIAGNOSIS — I10 Essential (primary) hypertension: Secondary | ICD-10-CM | POA: Insufficient documentation

## 2015-03-01 DIAGNOSIS — Z79899 Other long term (current) drug therapy: Secondary | ICD-10-CM | POA: Diagnosis not present

## 2015-03-01 DIAGNOSIS — R102 Pelvic and perineal pain: Secondary | ICD-10-CM | POA: Diagnosis not present

## 2015-03-01 HISTORY — DX: Calculus of gallbladder without cholecystitis without obstruction: K80.20

## 2015-03-01 LAB — URINALYSIS, ROUTINE W REFLEX MICROSCOPIC
GLUCOSE, UA: NEGATIVE mg/dL
Hgb urine dipstick: NEGATIVE
KETONES UR: NEGATIVE mg/dL
Leukocytes, UA: NEGATIVE
Nitrite: NEGATIVE
PH: 5.5 (ref 5.0–8.0)
Protein, ur: 30 mg/dL — AB
Specific Gravity, Urine: 1.025 (ref 1.005–1.030)

## 2015-03-01 LAB — COMPREHENSIVE METABOLIC PANEL
ALBUMIN: 4.5 g/dL (ref 3.5–5.0)
ALK PHOS: 61 U/L (ref 38–126)
ALT: 20 U/L (ref 17–63)
AST: 19 U/L (ref 15–41)
Anion gap: 9 (ref 5–15)
BUN: 9 mg/dL (ref 6–20)
CALCIUM: 9.1 mg/dL (ref 8.9–10.3)
CO2: 25 mmol/L (ref 22–32)
Chloride: 104 mmol/L (ref 101–111)
Creatinine, Ser: 1.02 mg/dL (ref 0.61–1.24)
GFR calc Af Amer: >60 mL/min (ref >60)
GFR calc non Af Amer: >60 mL/min (ref >60)
GLUCOSE: 120 mg/dL — AB (ref 65–99)
POTASSIUM: 3.3 mmol/L — AB (ref 3.5–5.1)
Sodium: 138 mmol/L (ref 135–145)
Total Bilirubin: 0.2 mg/dL — ABNORMAL LOW (ref 0.3–1.2)
Total Protein: 7.2 g/dL (ref 6.5–8.1)

## 2015-03-01 LAB — URINE MICROSCOPIC-ADD ON: RBC / HPF: NONE SEEN RBC/hpf (ref 0–5)

## 2015-03-01 LAB — CBC WITH DIFFERENTIAL/PLATELET
Basophils Absolute: 0 10*3/uL (ref 0.0–0.1)
Basophils Relative: 0 %
EOS ABS: 0.2 10*3/uL (ref 0.0–0.7)
Eosinophils Relative: 3 %
HCT: 41 % (ref 39.0–52.0)
Hemoglobin: 14 g/dL (ref 13.0–17.0)
Lymphocytes Relative: 16 %
Lymphs Abs: 1.2 10*3/uL (ref 0.7–4.0)
MCH: 30 pg (ref 26.0–34.0)
MCHC: 34.1 g/dL (ref 30.0–36.0)
MCV: 88 fL (ref 78.0–100.0)
MONOS PCT: 6 %
Monocytes Absolute: 0.5 10*3/uL (ref 0.1–1.0)
NEUTROS PCT: 75 %
Neutro Abs: 5.7 10*3/uL (ref 1.7–7.7)
Platelets: 209 10*3/uL (ref 150–400)
RBC: 4.66 MIL/uL (ref 4.22–5.81)
RDW: 12.9 % (ref 11.5–15.5)
WBC: 7.6 10*3/uL (ref 4.0–10.5)

## 2015-03-01 LAB — LIPASE, BLOOD: Lipase: 38 U/L (ref 11–51)

## 2015-03-01 MED ORDER — MORPHINE SULFATE (PF) 4 MG/ML IV SOLN
4.0000 mg | Freq: Once | INTRAVENOUS | Status: AC
  Administered 2015-03-01: 4 mg via INTRAVENOUS
  Filled 2015-03-01: qty 1

## 2015-03-01 MED ORDER — DOCUSATE SODIUM 100 MG PO CAPS: 100.0000 mg | ORAL_CAPSULE | Freq: Two times a day (BID) | ORAL | Status: DC

## 2015-03-01 MED ORDER — POLYETHYLENE GLYCOL 3350 17 G PO PACK: 17.0000 g | PACK | Freq: Every day | ORAL | Status: DC

## 2015-03-01 MED ORDER — DICYCLOMINE HCL 20 MG PO TABS: 20.0000 mg | ORAL_TABLET | Freq: Two times a day (BID) | ORAL | Status: DC | PRN

## 2015-03-01 MED ORDER — SODIUM CHLORIDE 0.9 % IV BOLUS (SEPSIS)
1000.0000 mL | Freq: Once | INTRAVENOUS | Status: AC
  Administered 2015-03-01: 1000 mL via INTRAVENOUS

## 2015-03-01 MED ORDER — KETOROLAC TROMETHAMINE 30 MG/ML IJ SOLN
30.0000 mg | Freq: Once | INTRAMUSCULAR | Status: AC
  Administered 2015-03-01: 30 mg via INTRAVENOUS
  Filled 2015-03-01: qty 1

## 2015-03-01 MED ORDER — LIDOCAINE VISCOUS 2 % MT SOLN: 15.0000 mL | Freq: Once | OROMUCOSAL | Status: DC

## 2015-03-01 NOTE — Discharge Instructions (Signed)
Call and make appointment to follow-up with general surgery. Return immediately for worsening pain, fever, persistent vomiting or any concerns.  Abdominal Pain, Adult Many things can cause abdominal pain. Usually, abdominal pain is not caused by a disease and will improve without treatment. It can often be observed and treated at home. Your health care provider will do a physical exam and possibly order blood tests and X-rays to help determine the seriousness of your pain. However, in many cases, more time must pass before a clear cause of the pain can be found. Before that point, your health care provider may not know if you need more testing or further treatment. HOME CARE INSTRUCTIONS Monitor your abdominal pain for any changes. The following actions may help to alleviate any discomfort you are experiencing:  Only take over-the-counter or prescription medicines as directed by your health care provider.  Do not take laxatives unless directed to do so by your health care provider.  Try a clear liquid diet (broth, tea, or water) as directed by your health care provider. Slowly move to a bland diet as tolerated. SEEK MEDICAL CARE IF:  You have unexplained abdominal pain.  You have abdominal pain associated with nausea or diarrhea.  You have pain when you urinate or have a bowel movement.  You experience abdominal pain that wakes you in the night.  You have abdominal pain that is worsened or improved by eating food.  You have abdominal pain that is worsened with eating fatty foods.  You have a fever. SEEK IMMEDIATE MEDICAL CARE IF:  Your pain does not go away within 2 hours.  You keep throwing up (vomiting).  Your pain is felt only in portions of the abdomen, such as the right side or the left lower portion of the abdomen.  You pass bloody or black tarry stools. MAKE SURE YOU:  Understand these instructions.  Will watch your condition.  Will get help right away if you are not  doing well or get worse.   This information is not intended to replace advice given to you by your health care provider. Make sure you discuss any questions you have with your health care provider.   Document Released: 12/02/2004 Document Revised: 11/13/2014 Document Reviewed: 11/01/2012 Elsevier Interactive Patient Education [employer] Elsevier Inc.

## 2015-03-01 NOTE — ED Notes (Signed)
Pt aware of urine sample.Pt does have a urinal

## 2015-03-01 NOTE — ED Notes (Signed)
Per EMS- patient reports a history of gallstones. Patient states he had abdominal pain this AM that woke him up. Patient states he took a percocet at 0600 with no relief.

## 2015-03-01 NOTE — ED Notes (Signed)
US in room 

## 2015-03-01 NOTE — ED Notes (Signed)
US will be perform test within an hour

## 2015-03-01 NOTE — ED Notes (Signed)
Ultrasound currently in room. 

## 2015-03-01 NOTE — ED Provider Notes (Signed)
CSN: [id number]     Arrival date & time 03/01/15  7829 History   First MD Initiated Contact with Patient 03/01/15 [phone]     Chief Complaint  Patient presents with  . Abdominal Pain     (Consider location/radiation/quality/duration/timing/severity/associated sxs/prior Treatment) HPI Patient presents with acute onset abdominal pain radiating to the back starting at 0600 waking him from sleep. Abdominal pain is located on the right side of the abdomen. He's had multiple episodes of this in the past. There is no nausea or vomiting. No fever or chills. Denies any urinary symptoms including hesitancy, dysuria, hematuria or frequency. States he was recently diagnosed with gallstones by bedside ultrasound. Took oxycodone without improvement of symptoms. Past Medical History  Diagnosis Date  . Hypertension   . Back pain   . Gall stones    History reviewed. No pertinent past surgical history. Family History  Problem Relation Age of Onset  . Cancer Father    Social History  Substance Use Topics  . Smoking status: Never Smoker   . Smokeless tobacco: Never Used  . Alcohol Use: No    Review of Systems  Constitutional: Negative for fever and chills.  Respiratory: Negative for shortness of breath.   Cardiovascular: Negative for chest pain.  Gastrointestinal: Positive for abdominal pain. Negative for nausea, vomiting, diarrhea and constipation.  Genitourinary: Negative for dysuria, frequency, flank pain, difficulty urinating, penile pain and testicular pain.  Musculoskeletal: Positive for back pain. Negative for neck pain and neck stiffness.  Skin: Negative for rash and wound.  Neurological: Negative for dizziness, weakness, light-headedness, numbness and headaches.  All other systems reviewed and are negative.     Allergies  Review of patient's allergies indicates no known allergies.  Home Medications   Prior to Admission medications   Medication Sig Start Date End Date Taking?  Authorizing Provider  ibuprofen (ADVIL,MOTRIN) 800 MG tablet Take 1 tablet by mouth every 8 (eight) hours as needed. pain 01/29/15  Yes Historical Provider, MD  lisinopril (PRINIVIL,ZESTRIL) 20 MG tablet Take 20 mg by mouth daily.   Yes Historical Provider, MD  omeprazole (PRILOSEC) 40 MG capsule Take 40 mg by mouth daily.   Yes Historical Provider, MD  ondansetron (ZOFRAN) 4 MG tablet Take 1 tablet by mouth every 4 (four) hours as needed. Nausea & vomiting 01/29/15  Yes Historical Provider, MD  OXYCODONE HCL PO Take 1 tablet by mouth daily as needed (severe pain.).   Yes Historical Provider, MD  cyclobenzaprine (FLEXERIL) 5 MG tablet Take 1 tablet (5 mg total) by mouth 2 (two) times daily as needed for muscle spasms. Patient not taking: Reported on 06/15/2014 03/26/13   Teressa Lower, NP  dicyclomine (BENTYL) 20 MG tablet Take 1 tablet (20 mg total) by mouth 2 (two) times daily as needed for spasms. 03/01/15   Loren Racer, MD  docusate sodium (COLACE) 100 MG capsule Take 1 capsule (100 mg total) by mouth every 12 (twelve) hours. 03/01/15   Loren Racer, MD  oxyCODONE-acetaminophen (PERCOCET/ROXICET) 5-325 MG per tablet Take 1-2 tablets by mouth every 4 (four) hours as needed for severe pain. Patient not taking: Reported on 06/15/2014 03/26/13   Teressa Lower, NP  polyethylene glycol (MIRALAX / GLYCOLAX) packet Take 17 g by mouth daily. 03/01/15   Loren Racer, MD   BP 118/63 mmHg  Pulse 76  Temp(Src) 99 F (37.2 C) (Oral)  Resp 16  SpO2 97% Physical Exam  Constitutional: He is oriented to person, place, and time. He appears well-developed and  well-nourished. No distress.  HENT:  Head: Normocephalic and atraumatic.  Mouth/Throat: Oropharynx is clear and moist.  Eyes: EOM are normal. Pupils are equal, round, and reactive to light.  Neck: Normal range of motion. Neck supple.  Cardiovascular: Normal rate and regular rhythm.   Pulmonary/Chest: Effort normal and breath sounds normal.  No respiratory distress. He has no wheezes. He has no rales.  Abdominal: Soft. Bowel sounds are normal. He exhibits no distension and no mass. There is tenderness (patient with mild right lower quadrant tenderness to palpation. There is no rebound or guarding. No upper abdominal tenderness with palpation.). There is no rebound and no guarding.  Musculoskeletal: Normal range of motion. He exhibits no edema or tenderness.  No CVA tenderness bilaterally. No new lower extremity swelling or pain.  Neurological: He is alert and oriented to person, place, and time.  Skin: Skin is warm and dry. No rash noted. No erythema.  Psychiatric: He has a normal mood and affect. His behavior is normal.  Nursing note and vitals reviewed.   ED Course  Procedures (including critical care time) Labs Review Labs Reviewed  COMPREHENSIVE METABOLIC PANEL - Abnormal; Notable for the following:    Potassium 3.3 (*)    Glucose, Bld 120 (*)    Total Bilirubin 0.2 (*)    All other components within normal limits  URINALYSIS, ROUTINE W REFLEX MICROSCOPIC (NOT AT [facility]) - Abnormal; Notable for the following:    Bilirubin Urine SMALL (*)    Protein, ur 30 (*)    All other components within normal limits  URINE MICROSCOPIC-ADD ON - Abnormal; Notable for the following:    Squamous Epithelial / LPF 0-5 (*)    Bacteria, UA FEW (*)    All other components within normal limits  CBC WITH DIFFERENTIAL/PLATELET  LIPASE, BLOOD    Imaging Review US Abdomen Complete  03/01/2015  CLINICAL DATA:  Right-sided abdominal pain and RIGHT flank pain. EXAM: ABDOMEN ULTRASOUND COMPLETE COMPARISON:  Ultrasound 01/29/2015, CT 06/15/2014 FINDINGS: Gallbladder: Multiple echogenic calculi layer dependently within the gallbladder lumen. Calculi are relatively small measuring approximate 5 mm and numbering approximately 12. Gallbladder wall is normal thickness. No pericholecystic fluid. Negative sonographic Murphy's sign. Common bile duct:  Diameter: Within normal limits at 3 mm. Liver: No focal lesion identified. Within normal limits in parenchymal echogenicity. IVC: No abnormality visualized. Pancreas: Visualized portion unremarkable. Spleen: Size and appearance within normal limits. Right Kidney: Length: 11.8 cm. Echogenicity within normal limits. No mass or hydronephrosis visualized. Left Kidney: Length: 11.6 cm. Echogenicity within normal limits. No mass or hydronephrosis visualized. Abdominal aorta: 2.0 cm Other findings: No ascites IMPRESSION: 1. Multiple gallstones without evidence acute cholecystitis. Similar findings to ultrasound of 01/29/2015. 2. No biliary duct dilatation or hepatic abnormality. Electronically Signed   By: Genevive Bi M.D.   On: 03/01/2015 13:19   US Pelvis Limited  03/01/2015  CLINICAL DATA:  Right pelvic, abdominal, and flank pain times 15 hours EXAM: LIMITED ULTRASOUND OF PELVIS TECHNIQUE: Limited transabdominal ultrasound examination of the pelvis was performed. COMPARISON:  CT 06/15/2014 FINDINGS: Urinary bladder incompletely distended.  No ascites. IMPRESSION: Unremarkable study Electronically Signed   By: Corlis Leak M.D.   On: 03/01/2015 13:21   I have personally reviewed and evaluated these images and lab results as part of my medical decision-making.   EKG Interpretation None      MDM   Final diagnoses:  Right lower quadrant abdominal pain   Patient very comfortable-appearing during stay. No vomiting. Ultrasound  does reveal multiple gallstones. No evidence of acute cholecystitis. Normal white blood cell count and liver function tests. Review of patient's previous CAT scan. He did have a significant amount stool in the right colon. Think his symptoms may be more related to bowel colic than biliary colic. Repeat abdominal exam with very mild right lower quadrant tenderness. He has no rebound or guarding. He has an appointment to follow-up with a general surgeon. He's been given return  precautions and has voiced understanding.     Loren Raceravid Addam Goeller, MD 03/02/15 [id number]

## 2015-03-01 NOTE — ED Notes (Signed)
MD informed of US time

## 2015-03-01 NOTE — ED Notes (Signed)
Bed: [id number] Expected date:  Expected time:  Means of arrival:  Comments: EMS-gallstones

## 2015-03-01 NOTE — ED Notes (Signed)
Patient informed that US called and stated it would be approx. another hour before they could do his US.

## 2015-03-14 ENCOUNTER — Other Ambulatory Visit: Payer: Self-pay | Admitting: Surgery

## 2015-03-14 DIAGNOSIS — K8020 Calculus of gallbladder without cholecystitis without obstruction: Secondary | ICD-10-CM | POA: Diagnosis not present

## 2015-03-23 ENCOUNTER — Encounter (HOSPITAL_COMMUNITY): Payer: Self-pay

## 2015-04-13 NOTE — Patient Instructions (Signed)
Neil Griffith  04/13/2015   Your procedure is scheduled on: April 17, 2015  Report to [facility] Main  Entrance take [place]  elevators to 3rd floor to  Short Stay Center at 6:45 AM.  Call this number if you have problems the morning of surgery [phone]   Remember: ONLY 1 PERSON MAY GO WITH YOU TO SHORT STAY TO GET  READY MORNING OF YOUR SURGERY.  Do not eat food or drink liquids :After Midnight.     Take these medicines the morning of surgery with A SIP OF WATER: Prilosec DO NOT TAKE ANY DIABETIC MEDICATIONS DAY OF YOUR SURGERY                               You may not have any metal on your body including hair pins and              piercings  Do not wear jewelry, lotions, powders or perfumes, deodorant                          Men may shave face and neck.   Do not bring valuables to the hospital. [facility] IS NOT             RESPONSIBLE   FOR VALUABLES.  Contacts, dentures or bridgework may not be worn into surgery.      Patients discharged the day of surgery will not be allowed to drive home.  Name and phone number of your driver:  Special Instructions: coughing and deep breathing exercises, leg exercises              Please read over the following fact sheets you were given: _____________________________________________________________________             [facility] - Preparing for Surgery Before surgery, you can play an important role.  Because skin is not sterile, your skin needs to be as free of germs as possible.  You can reduce the number of germs on your skin by washing with CHG (chlorahexidine gluconate) soap before surgery.  CHG is an antiseptic cleaner which kills germs and bonds with the skin to continue killing germs even after washing. Please DO NOT use if you have an allergy to CHG or antibacterial soaps.  If your skin becomes reddened/irritated stop using the CHG and inform your nurse when you arrive at Short Stay. Do not shave  (including legs and underarms) for at least 48 hours prior to the first CHG shower.  You may shave your face/neck. Please follow these instructions carefully:  1.  Shower with CHG Soap the night before surgery and the  morning of Surgery.  2.  If you choose to wash your hair, wash your hair first as usual with your  normal  shampoo.  3.  After you shampoo, rinse your hair and body thoroughly to remove the  shampoo.                           4.  Use CHG as you would any other liquid soap.  You can apply chg directly  to the skin and wash                       Gently with  a scrungie or clean washcloth.  5.  Apply the CHG Soap to your body ONLY FROM THE NECK DOWN.   Do not use on face/ open                           Wound or open sores. Avoid contact with eyes, ears mouth and genitals (private parts).                       Wash face,  Genitals (private parts) with your normal soap.             6.  Wash thoroughly, paying special attention to the area where your surgery  will be performed.  7.  Thoroughly rinse your body with warm water from the neck down.  8.  DO NOT shower/wash with your normal soap after using and rinsing off  the CHG Soap.                9.  Pat yourself dry with a clean towel.            10.  Wear clean pajamas.            11.  Place clean sheets on your bed the night of your first shower and do not  sleep with pets. Day of Surgery : Do not apply any lotions/deodorants the morning of surgery.  Please wear clean clothes to the hospital/surgery center.  FAILURE TO FOLLOW THESE INSTRUCTIONS MAY RESULT IN THE CANCELLATION OF YOUR SURGERY PATIENT SIGNATURE_________________________________  NURSE SIGNATURE__________________________________  ________________________________________________________________________

## 2015-04-14 ENCOUNTER — Encounter (HOSPITAL_COMMUNITY): Payer: Self-pay

## 2015-04-14 ENCOUNTER — Encounter (HOSPITAL_COMMUNITY)
Admission: RE | Admit: 2015-04-14 | Discharge: 2015-04-14 | Disposition: A | Discharge status: Home / Self Care | Payer: Medicare Other | Source: Ambulatory Visit | Attending: Surgery | Admitting: Surgery

## 2015-04-14 DIAGNOSIS — Z79899 Other long term (current) drug therapy: Secondary | ICD-10-CM | POA: Diagnosis not present

## 2015-04-14 DIAGNOSIS — I10 Essential (primary) hypertension: Secondary | ICD-10-CM | POA: Diagnosis not present

## 2015-04-14 DIAGNOSIS — K219 Gastro-esophageal reflux disease without esophagitis: Secondary | ICD-10-CM | POA: Diagnosis not present

## 2015-04-14 DIAGNOSIS — K8020 Calculus of gallbladder without cholecystitis without obstruction: Secondary | ICD-10-CM | POA: Diagnosis present

## 2015-04-14 DIAGNOSIS — K8010 Calculus of gallbladder with chronic cholecystitis without obstruction: Secondary | ICD-10-CM | POA: Diagnosis not present

## 2015-04-14 HISTORY — DX: Reserved for inherently not codable concepts without codable children: IMO0001

## 2015-04-14 HISTORY — DX: Unspecified convulsions: R56.9

## 2015-04-14 HISTORY — DX: Gastro-esophageal reflux disease without esophagitis: K21.9

## 2015-04-14 HISTORY — DX: Family history of other specified conditions: Z84.89

## 2015-04-14 LAB — BASIC METABOLIC PANEL
Anion gap: 9 (ref 5–15)
BUN: 14 mg/dL (ref 6–20)
CO2: 25 mmol/L (ref 22–32)
Calcium: 9.4 mg/dL (ref 8.9–10.3)
Chloride: 104 mmol/L (ref 101–111)
Creatinine, Ser: 0.95 mg/dL (ref 0.61–1.24)
GFR calc non Af Amer: >60 mL/min (ref >60)
GLUCOSE: 102 mg/dL — AB (ref 65–99)
POTASSIUM: 4 mmol/L (ref 3.5–5.1)
SODIUM: 138 mmol/L (ref 135–145)

## 2015-04-14 LAB — CBC
HCT: 41.5 % (ref 39.0–52.0)
HEMOGLOBIN: 14 g/dL (ref 13.0–17.0)
MCH: 29.5 pg (ref 26.0–34.0)
MCHC: 33.7 g/dL (ref 30.0–36.0)
MCV: 87.6 fL (ref 78.0–100.0)
PLATELETS: 225 10*3/uL (ref 150–400)
RBC: 4.74 MIL/uL (ref 4.22–5.81)
RDW: 12.7 % (ref 11.5–15.5)
WBC: 5.8 10*3/uL (ref 4.0–10.5)

## 2015-04-14 NOTE — Progress Notes (Signed)
03-01-15 - U/S Pelvis - EPIC 03-01-15 - U/S Abd - EPIC 03-01-15 - UA/Culture - EPIC 03-23-14 - 2V CXR - EPIC

## 2015-04-16 NOTE — H&P (Signed)
Neil Griffith  Location: [facility] Patient #: [id number] DOB: 12/10/1980 Single / Language: [place] / Race: White Male   History of Present Illness The patient is a 35 year old male who presents for evaluation of gall stones. This gentleman is referred by Dr. Hall Busing for evaluation of symptomatic cholelithiasis. For several weeks, he has had intermittent right upper quadrant abdominal pain with nausea after fatty meals. He even had to go to the emergency department for one attack. The pain is described as moderate to severe and sharp. He is currently pain-free today. He has had nausea but no vomiting. He did have some constipation as well. He is otherwise healthy without complaints.   Other Problems  Anxiety Disorder Back Pain Cholelithiasis Gastroesophageal Reflux Disease Hemorrhoids High blood pressure  Past Surgical History Fay Records, CMA;  No pertinent past surgical history  Diagnostic Studies History Fay Records, CMA; Colonoscopy never  Allergies Fay Records, CMA;  No Known Drug Allergies  Medication History Fay Records, CMA; Oxycodone-Acetaminophen (5-325MG  Tablet, Oral) Active. Omeprazole (  Capsule DR, Oral) Active. Lisinopril (  Tablet, Oral) Active. Medications Reconciled  Social History Fay Records, [place];  Caffeine use Carbonated beverages, Tea. No alcohol use No drug use Tobacco use Never smoker.  Family History Fay Records, CMA;  Alcohol Abuse Father. Anesthetic complications Father. Arthritis Mother. Bleeding disorder Father. Cancer Father. Cerebrovascular Accident Father. Colon Polyps Father. Hypertension Brother, Father, Sister. Migraine Headache Father, Mother. Respiratory Condition Father. Thyroid problems Mother.    Review of Systems  General Not Present- Appetite Loss, Chills, Fatigue, Fever, Night Sweats, Weight Gain and Weight Loss. Skin Present- Dryness. Not  Present- Change in Wart/Mole, Hives, Jaundice, New Lesions, Non-Healing Wounds, Rash and Ulcer. HEENT Present- Sore Throat. Not Present- Earache, Hearing Loss, Hoarseness, Nose Bleed, Oral Ulcers, Ringing in the Ears, Seasonal Allergies, Sinus Pain, Visual Disturbances, Wears glasses/contact lenses and Yellow Eyes. Respiratory Not Present- Bloody sputum, Chronic Cough, Difficulty Breathing, Snoring and Wheezing. Breast Not Present- Breast Mass, Breast Pain, Nipple Discharge and Skin Changes. Cardiovascular Not Present- Chest Pain, Difficulty Breathing Lying Down, Leg Cramps, Palpitations, Rapid Heart Rate, Shortness of Breath and Swelling of Extremities. Gastrointestinal Present- Abdominal Pain, Difficulty Swallowing, Excessive gas, Gets full quickly at meals, Hemorrhoids and Indigestion. Not Present- Bloating, Bloody Stool, Change in Bowel Habits, Chronic diarrhea, Constipation, Nausea, Rectal Pain and Vomiting. Male Genitourinary Not Present- Blood in Urine, Change in Urinary Stream, Frequency, Impotence, Nocturia, Painful Urination, Urgency and Urine Leakage. Musculoskeletal Present- Back Pain. Not Present- Joint Pain, Joint Stiffness, Muscle Pain, Muscle Weakness and Swelling of Extremities. Neurological Present- Headaches. Not Present- Decreased Memory, Fainting, Numbness, Seizures, Tingling, Tremor, Trouble walking and Weakness. Psychiatric Present- Anxiety. Not Present- Bipolar, Change in Sleep Pattern, Depression, Fearful and Frequent crying. Endocrine Not Present- Cold Intolerance, Excessive Hunger, Hair Changes, Heat Intolerance, Hot flashes and New Diabetes. Hematology Not Present- Easy Bruising, Excessive bleeding, Gland problems, HIV and Persistent Infections.  Vitals   Weight: 207 lb Height: 72in Body Surface Area: 2.16 m Body Mass Index: 28.07 kg/m  Temp.: 98.33F(Temporal)  Pulse: 71 (Regular)  BP: 128/82 (Sitting, Left Arm, Standard)       Physical Exam   General Mental Status-Alert. General Appearance-Consistent with stated age. Hydration-Well hydrated. Voice-Normal.  Head and Neck Head-normocephalic, atraumatic with no lesions or palpable masses.  Eye Eyeball - Bilateral-Extraocular movements intact. Sclera/Conjunctiva - Bilateral-No scleral icterus.  Chest and Lung Exam Chest and lung exam reveals -quiet, even and easy respiratory effort with no use of accessory  muscles and on auscultation, normal breath sounds, no adventitious sounds and normal vocal resonance. Inspection Chest Wall - Normal. Back - normal.  Cardiovascular Cardiovascular examination reveals -on palpation PMI is normal in location and amplitude, no palpable S3 or S4. Normal cardiac borders., normal heart sounds, regular rate and rhythm with no murmurs, carotid auscultation reveals no bruits and normal pedal pulses bilaterally.  Abdomen Inspection Inspection of the abdomen reveals - No Hernias. Skin - Scar - no surgical scars. Palpation/Percussion Palpation and Percussion of the abdomen reveal - Soft, No Rebound tenderness, No Rigidity (guarding) and No hepatosplenomegaly. Tenderness - Note: Very mild tenderness with guarding in the right upper quadrant. Auscultation Auscultation of the abdomen reveals - Bowel sounds normal.  Neurologic - Did not examine.  Musculoskeletal Normal Exam - Left-Upper Extremity Strength Normal and Lower Extremity Strength Normal. Normal Exam - Right-Upper Extremity Strength Normal, Lower Extremity Weakness.    Assessment & Plan    SYMPTOMATIC CHOLELITHIASIS (K80.20) Impression: His ultrasound does show gallstones with a normal bile duct and no gallbladder wall thickening. His liver function tests are normal. He does have symptomatic cholelithiasis. Laparoscopic cholecystectomy is recommended. I discussed surgery with him in detail and gave him literature regarding surgery. I discussed the risks of surgery  which includes but is not limited to bleeding, infection, bile duct injury, bile leak, injury to other structures, the need to convert to an open procedure, postoperative recovery, etc. He understands and wishes to proceed with surgery which will be scheduled

## 2015-04-17 ENCOUNTER — Encounter (HOSPITAL_COMMUNITY)
Admission: RE | Disposition: A | Discharge status: Home / Self Care | Payer: Self-pay | Source: Ambulatory Visit | Attending: Surgery

## 2015-04-17 ENCOUNTER — Ambulatory Visit (HOSPITAL_COMMUNITY): Payer: Medicare Other | Admitting: Certified Registered Nurse Anesthetist

## 2015-04-17 ENCOUNTER — Ambulatory Visit (HOSPITAL_COMMUNITY)
Admission: RE | Admit: 2015-04-17 | Discharge: 2015-04-17 | Disposition: A | Discharge status: Home / Self Care | Payer: Medicare Other | Source: Ambulatory Visit | Attending: Surgery | Admitting: Surgery

## 2015-04-17 ENCOUNTER — Encounter (HOSPITAL_COMMUNITY): Payer: Self-pay | Admitting: *Deleted

## 2015-04-17 DIAGNOSIS — K8010 Calculus of gallbladder with chronic cholecystitis without obstruction: Secondary | ICD-10-CM | POA: Insufficient documentation

## 2015-04-17 DIAGNOSIS — I10 Essential (primary) hypertension: Secondary | ICD-10-CM | POA: Insufficient documentation

## 2015-04-17 DIAGNOSIS — Z79899 Other long term (current) drug therapy: Secondary | ICD-10-CM | POA: Diagnosis not present

## 2015-04-17 DIAGNOSIS — K8020 Calculus of gallbladder without cholecystitis without obstruction: Secondary | ICD-10-CM | POA: Diagnosis not present

## 2015-04-17 DIAGNOSIS — K219 Gastro-esophageal reflux disease without esophagitis: Secondary | ICD-10-CM | POA: Diagnosis not present

## 2015-04-17 HISTORY — PX: CHOLECYSTECTOMY: SHX55

## 2015-04-17 SURGERY — LAPAROSCOPIC CHOLECYSTECTOMY
Anesthesia: General

## 2015-04-17 MED ORDER — BUPIVACAINE HCL (PF) 0.5 % IJ SOLN
INTRAMUSCULAR | Status: DC | PRN
  Administered 2015-04-17: 20 mL

## 2015-04-17 MED ORDER — SODIUM CHLORIDE 0.9% FLUSH: 3.0000 mL | INTRAVENOUS | Status: DC | PRN

## 2015-04-17 MED ORDER — PROMETHAZINE HCL 25 MG/ML IJ SOLN
INTRAMUSCULAR | Status: AC
  Filled 2015-04-17: qty 1

## 2015-04-17 MED ORDER — KETOROLAC TROMETHAMINE 30 MG/ML IJ SOLN: 30.0000 mg | Freq: Once | INTRAMUSCULAR | Status: DC

## 2015-04-17 MED ORDER — SUGAMMADEX SODIUM 200 MG/2ML IV SOLN
INTRAVENOUS | Status: DC | PRN
  Administered 2015-04-17: 200 mg via INTRAVENOUS

## 2015-04-17 MED ORDER — PROPOFOL 10 MG/ML IV BOLUS
INTRAVENOUS | Status: DC | PRN
  Administered 2015-04-17: 200 mg via INTRAVENOUS

## 2015-04-17 MED ORDER — LACTATED RINGERS IR SOLN
Status: DC | PRN
  Administered 2015-04-17: 1

## 2015-04-17 MED ORDER — ROCURONIUM BROMIDE 100 MG/10ML IV SOLN
INTRAVENOUS | Status: DC | PRN
  Administered 2015-04-17: 35 mg via INTRAVENOUS
  Administered 2015-04-17: 5 mg via INTRAVENOUS

## 2015-04-17 MED ORDER — KETOROLAC TROMETHAMINE 30 MG/ML IJ SOLN
INTRAMUSCULAR | Status: AC
  Filled 2015-04-17: qty 1

## 2015-04-17 MED ORDER — MIDAZOLAM HCL 2 MG/2ML IJ SOLN
INTRAMUSCULAR | Status: AC
  Filled 2015-04-17: qty 2

## 2015-04-17 MED ORDER — LIDOCAINE HCL (CARDIAC) 20 MG/ML IV SOLN
INTRAVENOUS | Status: DC | PRN
  Administered 2015-04-17: 100 mg via INTRAVENOUS

## 2015-04-17 MED ORDER — ACETAMINOPHEN 10 MG/ML IV SOLN
INTRAVENOUS | Status: DC | PRN
  Administered 2015-04-17: 1000 mg via INTRAVENOUS

## 2015-04-17 MED ORDER — HYDROCODONE-ACETAMINOPHEN 7.5-325 MG PO TABS: 1.0000 | ORAL_TABLET | Freq: Once | ORAL | Status: DC | PRN

## 2015-04-17 MED ORDER — ESMOLOL HCL 100 MG/10ML IV SOLN
INTRAVENOUS | Status: DC | PRN
  Administered 2015-04-17: 20 mg via INTRAVENOUS

## 2015-04-17 MED ORDER — CEFAZOLIN SODIUM-DEXTROSE 2-3 GM-% IV SOLR
2.0000 g | INTRAVENOUS | Status: AC
  Administered 2015-04-17: 2 g via INTRAVENOUS

## 2015-04-17 MED ORDER — HYDROMORPHONE HCL 1 MG/ML IJ SOLN
INTRAMUSCULAR | Status: AC
  Filled 2015-04-17: qty 1

## 2015-04-17 MED ORDER — ACETAMINOPHEN 650 MG RE SUPP
650.0000 mg | RECTAL | Status: DC | PRN
  Filled 2015-04-17: qty 1

## 2015-04-17 MED ORDER — FENTANYL CITRATE (PF) 100 MCG/2ML IJ SOLN
INTRAMUSCULAR | Status: DC | PRN
  Administered 2015-04-17: 50 ug via INTRAVENOUS
  Administered 2015-04-17 (×2): 100 ug via INTRAVENOUS

## 2015-04-17 MED ORDER — ONDANSETRON HCL 4 MG/2ML IJ SOLN
INTRAMUSCULAR | Status: AC
  Filled 2015-04-17: qty 2

## 2015-04-17 MED ORDER — OXYCODONE-ACETAMINOPHEN 5-325 MG PO TABS: 1.0000 | ORAL_TABLET | ORAL | Status: AC | PRN

## 2015-04-17 MED ORDER — EPHEDRINE SULFATE 50 MG/ML IJ SOLN
INTRAMUSCULAR | Status: AC
  Filled 2015-04-17: qty 1

## 2015-04-17 MED ORDER — LACTATED RINGERS IV SOLN
INTRAVENOUS | Status: DC
  Administered 2015-04-17: 1000 mL via INTRAVENOUS
  Administered 2015-04-17: 11:00:00 via INTRAVENOUS

## 2015-04-17 MED ORDER — ACETAMINOPHEN 325 MG PO TABS: 650.0000 mg | ORAL_TABLET | ORAL | Status: DC | PRN

## 2015-04-17 MED ORDER — DEXAMETHASONE SODIUM PHOSPHATE 10 MG/ML IJ SOLN
INTRAMUSCULAR | Status: AC
  Filled 2015-04-17: qty 1

## 2015-04-17 MED ORDER — DEXAMETHASONE SODIUM PHOSPHATE 10 MG/ML IJ SOLN
INTRAMUSCULAR | Status: DC | PRN
  Administered 2015-04-17: 10 mg via INTRAVENOUS

## 2015-04-17 MED ORDER — OXYCODONE HCL 5 MG PO TABS: 5.0000 mg | ORAL_TABLET | ORAL | Status: DC | PRN

## 2015-04-17 MED ORDER — FENTANYL CITRATE (PF) 250 MCG/5ML IJ SOLN
INTRAMUSCULAR | Status: AC
  Filled 2015-04-17: qty 5

## 2015-04-17 MED ORDER — PROMETHAZINE HCL 25 MG/ML IJ SOLN
6.2500 mg | INTRAMUSCULAR | Status: DC | PRN
  Administered 2015-04-17: 6.25 mg via INTRAVENOUS

## 2015-04-17 MED ORDER — SODIUM CHLORIDE 0.9 % IV SOLN: 250.0000 mL | INTRAVENOUS | Status: DC | PRN

## 2015-04-17 MED ORDER — ROCURONIUM BROMIDE 100 MG/10ML IV SOLN
INTRAVENOUS | Status: AC
  Filled 2015-04-17: qty 1

## 2015-04-17 MED ORDER — BUPIVACAINE HCL (PF) 0.5 % IJ SOLN
INTRAMUSCULAR | Status: AC
  Filled 2015-04-17: qty 30

## 2015-04-17 MED ORDER — SUGAMMADEX SODIUM 200 MG/2ML IV SOLN
INTRAVENOUS | Status: AC
  Filled 2015-04-17: qty 2

## 2015-04-17 MED ORDER — LIDOCAINE HCL (CARDIAC) 20 MG/ML IV SOLN
INTRAVENOUS | Status: AC
  Filled 2015-04-17: qty 5

## 2015-04-17 MED ORDER — HYDROMORPHONE HCL 1 MG/ML IJ SOLN
0.2500 mg | INTRAMUSCULAR | Status: DC | PRN
  Administered 2015-04-17: 0.5 mg via INTRAVENOUS
  Administered 2015-04-17: 0.25 mg via INTRAVENOUS

## 2015-04-17 MED ORDER — SODIUM CHLORIDE 0.9% FLUSH: 3.0000 mL | Freq: Two times a day (BID) | INTRAVENOUS | Status: DC

## 2015-04-17 MED ORDER — ESMOLOL HCL 100 MG/10ML IV SOLN
INTRAVENOUS | Status: AC
  Filled 2015-04-17: qty 10

## 2015-04-17 MED ORDER — CEFAZOLIN SODIUM-DEXTROSE 2-3 GM-% IV SOLR
INTRAVENOUS | Status: AC
  Filled 2015-04-17: qty 50

## 2015-04-17 MED ORDER — KETOROLAC TROMETHAMINE 30 MG/ML IJ SOLN
INTRAMUSCULAR | Status: DC | PRN
  Administered 2015-04-17: 30 mg via INTRAVENOUS

## 2015-04-17 MED ORDER — MORPHINE SULFATE (PF) 10 MG/ML IV SOLN: 1.0000 mg | INTRAVENOUS | Status: DC | PRN

## 2015-04-17 MED ORDER — PROPOFOL 10 MG/ML IV BOLUS
INTRAVENOUS | Status: AC
  Filled 2015-04-17: qty 20

## 2015-04-17 MED ORDER — SODIUM CHLORIDE 0.9 % IJ SOLN
INTRAMUSCULAR | Status: AC
  Filled 2015-04-17: qty 10

## 2015-04-17 MED ORDER — 0.9 % SODIUM CHLORIDE (POUR BTL) OPTIME
TOPICAL | Status: DC | PRN
  Administered 2015-04-17: 1000 mL

## 2015-04-17 MED ORDER — ONDANSETRON HCL 4 MG/2ML IJ SOLN
INTRAMUSCULAR | Status: DC | PRN
  Administered 2015-04-17: 4 mg via INTRAVENOUS

## 2015-04-17 MED ORDER — MIDAZOLAM HCL 5 MG/5ML IJ SOLN
INTRAMUSCULAR | Status: DC | PRN
  Administered 2015-04-17: 2 mg via INTRAVENOUS

## 2015-04-17 SURGICAL SUPPLY — 32 items
APL SKNCLS STERI-STRIP NONHPOA (GAUZE/BANDAGES/DRESSINGS)
APPLIER CLIP 5 13 M/L LIGAMAX5 (MISCELLANEOUS) ×3
APR CLP MED LRG 5 ANG JAW (MISCELLANEOUS) ×1
BAG SPEC RTRVL LRG 6X4 10 (ENDOMECHANICALS) ×1
BANDAGE ADH SHEER 1  50/CT (GAUZE/BANDAGES/DRESSINGS) IMPLANT
BENZOIN TINCTURE PRP APPL 2/3 (GAUZE/BANDAGES/DRESSINGS) IMPLANT
CHLORAPREP W/TINT 26ML (MISCELLANEOUS) ×3 IMPLANT
CLIP APPLIE 5 13 M/L LIGAMAX5 (MISCELLANEOUS) ×1 IMPLANT
CLOSURE WOUND 1/2 X4 (GAUZE/BANDAGES/DRESSINGS)
COVER MAYO STAND STRL (DRAPES) IMPLANT
COVER SURGICAL LIGHT HANDLE (MISCELLANEOUS) ×3 IMPLANT
DECANTER SPIKE VIAL GLASS SM (MISCELLANEOUS) ×3 IMPLANT
DRAPE C-ARM 42X120 X-RAY (DRAPES) IMPLANT
DRAPE LAPAROSCOPIC ABDOMINAL (DRAPES) ×3 IMPLANT
ELECT REM PT RETURN 9FT ADLT (ELECTROSURGICAL) ×3
ELECTRODE REM PT RTRN 9FT ADLT (ELECTROSURGICAL) ×1 IMPLANT
GLOVE SURG SIGNA 7.5 PF LTX (GLOVE) ×3 IMPLANT
GOWN STRL REUS W/TWL XL LVL3 (GOWN DISPOSABLE) ×6 IMPLANT
HEMOSTAT SURGICEL 4X8 (HEMOSTASIS) IMPLANT
KIT BASIN OR (CUSTOM PROCEDURE TRAY) ×3 IMPLANT
LIQUID BAND (GAUZE/BANDAGES/DRESSINGS) ×2 IMPLANT
POUCH SPECIMEN RETRIEVAL 10MM (ENDOMECHANICALS) ×3 IMPLANT
SET CHOLANGIOGRAPH MIX (MISCELLANEOUS) IMPLANT
SET IRRIG TUBING LAPAROSCOPIC (IRRIGATION / IRRIGATOR) ×3 IMPLANT
STRIP CLOSURE SKIN 1/2X4 (GAUZE/BANDAGES/DRESSINGS) IMPLANT
SUT MNCRL AB 4-0 PS2 18 (SUTURE) ×3 IMPLANT
SUT VICRYL 0 ENDOLOOP (SUTURE) ×2 IMPLANT
TOWEL OR 17X26 10 PK STRL BLUE (TOWEL DISPOSABLE) ×3 IMPLANT
TRAY LAPAROSCOPIC (CUSTOM PROCEDURE TRAY) ×3 IMPLANT
TROCAR BLADELESS OPT 5 75 (ENDOMECHANICALS) ×3 IMPLANT
TROCAR SLEEVE XCEL 5X75 (ENDOMECHANICALS) ×6 IMPLANT
TROCAR XCEL BLUNT TIP 100MML (ENDOMECHANICALS) ×3 IMPLANT

## 2015-04-17 NOTE — Discharge Instructions (Signed)
[facility] ______[facility] LAPAROSCOPIC SURGERY: POST OP INSTRUCTIONS Always review your discharge instruction sheet given to you by the facility where your surgery was performed. IF YOU HAVE DISABILITY OR FAMILY LEAVE FORMS, YOU MUST BRING THEM TO THE OFFICE FOR PROCESSING.   DO NOT GIVE THEM TO YOUR DOCTOR.  1. A prescription for pain medication may be given to you upon discharge.  Take your pain medication as prescribed, if needed.  If narcotic pain medicine is not needed, then you may take acetaminophen (Tylenol) or ibuprofen (Advil) as needed. 2. Take your usually prescribed medications unless otherwise directed. 3. If you need a refill on your pain medication, please contact your pharmacy.  They will contact our office to request authorization. Prescriptions will not be filled after 5pm or on week-ends. 4. You should follow a light diet the first few days after arrival home, such as soup and crackers, etc.  Be sure to include lots of fluids daily. 5. Most patients will experience some swelling and bruising in the area of the incisions.  Ice packs will help.  Swelling and bruising can take several days to resolve.  6. It is common to experience some constipation if taking pain medication after surgery.  Increasing fluid intake and taking a stool softener (such as Colace) will usually help or prevent this problem from occurring.  A mild laxative (Milk of Magnesia or Miralax) should be taken according to package instructions if there are no bowel movements after 48 hours. 7. Unless discharge instructions indicate otherwise, you may remove your bandages 24-48 hours after surgery, and you may shower at that time.  You may have steri-strips (small skin tapes) in place directly over the incision.  These strips should be left on the skin for 7-10 days.  If your surgeon used skin glue on the incision, you may shower in 24 hours.  The glue will flake off over the next 2-3 weeks.  Any sutures or  staples will be removed at the office during your follow-up visit. 8. ACTIVITIES:  You may resume regular (light) daily activities beginning the next day--such as daily self-care, walking, climbing stairs--gradually increasing activities as tolerated.  You may have sexual intercourse when it is comfortable.  Refrain from any heavy lifting or straining until approved by your doctor. a. You may drive when you are no longer taking prescription pain medication, you can comfortably wear a seatbelt, and you can safely maneuver your car and apply brakes. b. RETURN TO WORK:  __________________________________________________________ 9. You should see your doctor in the office for a follow-up appointment approximately 2-3 weeks after your surgery.  Make sure that you call for this appointment within a day or two after you arrive home to insure a convenient appointment time. 10. OTHER INSTRUCTIONS:NO LIFTING MORE THAN 15 TO 20 POUNDS FOR 2 WEEKS 11. MAY SHOWER TOMORROW 12. ICE PACK AND IBUPROFEN ALSO FOR PAIN __________________________________________________________________________________________________________________________ __________________________________________________________________________________________________________________________ WHEN TO CALL YOUR DOCTOR: 1. Fever over 101.0 2. Inability to urinate 3. Continued bleeding from incision. 4. Increased pain, redness, or drainage from the incision. 5. Increasing abdominal pain  The clinic staff is available to answer your questions during regular business hours.  Please dont hesitate to call and ask to speak to one of the nurses for clinical concerns.  If you have a medical emergency, go to the nearest emergency room or call 911.  A surgeon from [facility] is always on call at the hospital. [address], [place],  [place]  [address] ? [address] [address], [place], [place]   [address] [phone] ? [phone] ? FAX  [phone] Web site: [email or web address] Post Anesthesia Home Care Instructions  Activity: Get plenty of rest for the remainder of the day. A responsible adult should stay with you for 24 hours following the procedure.  For the next 24 hours, DO NOT: -Drive a car -Advertising copywriter -Drink alcoholic beverages -Take any medication unless instructed by your physician -Make any legal decisions or sign important papers.  Meals: Start with liquid foods such as gelatin or soup. Progress to regular foods as tolerated. Avoid greasy, spicy, heavy foods. If nausea and/or vomiting occur, drink only clear liquids until the nausea and/or vomiting subsides. Call your physician if vomiting continues.  Special Instructions/Symptoms: Your throat may feel dry or sore from the anesthesia or the breathing tube placed in your throat during surgery. If this causes discomfort, gargle with warm salt water. The discomfort should disappear within 24 hours.  If you had a scopolamine patch placed behind your ear for the management of post- operative nausea and/or vomiting:  1. The medication in the patch is effective for 72 hours, after which it should be removed.  Wrap patch in a tissue and discard in the trash. Wash hands thoroughly with soap and water. 2. You may remove the patch earlier than 72 hours if you experience unpleasant side effects which may include dry mouth, dizziness or visual disturbances. 3. Avoid touching the patch. Wash your hands with soap and water after contact with the patch.

## 2015-04-17 NOTE — Anesthesia Preprocedure Evaluation (Addendum)
Anesthesia Evaluation  Patient identified by MRN, date of birth, ID band Patient awake    Reviewed: Allergy & Precautions, NPO status , Patient's Chart, lab work & pertinent test results  Airway Mallampati: I  TM Distance: >3 FB Neck ROM: Full    Dental  (+) Dental Advisory Given   Pulmonary neg pulmonary ROS,    breath sounds clear to auscultation       Cardiovascular hypertension, Pt. on medications  Rhythm:Regular Rate:Normal     Neuro/Psych negative neurological ROS     GI/Hepatic Neg liver ROS, GERD  Medicated,  Endo/Other  negative endocrine ROS  Renal/GU negative Renal ROS     Musculoskeletal   Abdominal   Peds  Hematology   Anesthesia Other Findings   Reproductive/Obstetrics                            Lab Results  Component Value Date   WBC 5.8 04/14/2015   HGB 14.0 04/14/2015   HCT 41.5 04/14/2015   MCV 87.6 04/14/2015   PLT 225 04/14/2015   Lab Results  Component Value Date   CREATININE 0.95 04/14/2015   BUN 14 04/14/2015   NA 138 04/14/2015   K 4.0 04/14/2015   CL 104 04/14/2015   CO2 25 04/14/2015     Anesthesia Physical Anesthesia Plan  ASA: II  Anesthesia Plan: General   Post-op Pain Management:    Induction: Intravenous  Airway Management Planned: Oral ETT  Additional Equipment:   Intra-op Plan:   Post-operative Plan: Extubation in OR  Informed Consent: I have reviewed the patients History and Physical, chart, labs and discussed the procedure including the risks, benefits and alternatives for the proposed anesthesia with the patient or authorized representative who has indicated his/her understanding and acceptance.   Dental advisory given  Plan Discussed with: CRNA  Anesthesia Plan Comments:         Anesthesia Quick Evaluation

## 2015-04-17 NOTE — Transfer of Care (Signed)
Immediate Anesthesia Transfer of Care Note  Patient: Neil Griffith  Procedure(s) Performed: Procedure(s): LAPAROSCOPIC CHOLECYSTECTOMY (N/A)  Patient Location: PACU  Anesthesia Type:General  Level of Consciousness:  sedated, patient cooperative and responds to stimulation  Airway & Oxygen Therapy:Patient Spontanous Breathing and Patient connected to face mask oxgen  Post-op Assessment:  Report given to PACU RN and Post -op Vital signs reviewed and stable  Post vital signs:  Reviewed and stable  Last Vitals:  Filed Vitals:   04/17/15 0645  BP: 127/67  Pulse: 80  Temp: 36.7 C  Resp: 16    Complications: No apparent anesthesia complications

## 2015-04-17 NOTE — Op Note (Signed)
LAPAROSCOPIC CHOLECYSTECTOMY  Procedure Note  MERCED HANNERS 04/17/2015   Pre-op Diagnosis: SYMPTOMATIC GALLSTONES     Post-op Diagnosis: chronic cholecystitis with cholelithiasis  Procedure(s): LAPAROSCOPIC CHOLECYSTECTOMY  Surgeon(s): Abigail Miyamoto, MD  Anesthesia: General  Staff:  Circulator: Kem Parkinson, RN Relief Circulator: Lubertha South, RN Scrub Person: Princess Bruins; [place], [place]  Estimated Blood Loss: Minimal               Specimens: sent to path          [facility]   Date: 04/17/2015  Time: 10:50 AM

## 2015-04-17 NOTE — Op Note (Signed)
[name], [name] NO.:  [id number]  MEDICAL RECORD NO.:  [id number]  LOCATION:  [facility]                         FACILITY:  [facility]  PHYSICIAN:  Abigail Miyamoto, M.D. DATE OF BIRTH:  1980/06/29  DATE OF PROCEDURE:  04/17/2015 DATE OF DISCHARGE:                              OPERATIVE REPORT   PREOPERATIVE DIAGNOSIS:  Symptomatic cholelithiasis.  POSTOPERATIVE DIAGNOSES:  Chronic cholecystitis with cholelithiasis.  PROCEDURE:  Laparoscopic cholecystectomy.  SURGEON:  Abigail Miyamoto, M.D.  ANESTHESIA:  General and 0.5% Marcaine.  ESTIMATED BLOOD LOSS:  Minimal.  INDICATIONS:  This is a 35 year old gentleman who has had several attacks of symptomatic cholelithiasis.  His ultrasound was remarkable for gallstones.  Decision was made to proceed with cholecystectomy.  FINDINGS:  The patient was found to have a chronically scarred, contracted gallbladder consistent with chronic cholecystitis and gallstones.  PROCEDURE IN DETAIL:  The patient was brought to the operating room, identified as Neil Griffith.  He was placed supine on the operating room table and general anesthesia was induced.  His abdomen was then prepped and draped in usual sterile fashion.  I made a small vertical incision below the umbilicus.  I carried this down to the fascia, which was then opened with scalpel.  Hemostat was then used to pass easily to the peritoneal cavity under direct vision.  A 0 Vicryl pursestring sutures placed around the fascial opening.  The Hasson port was placed through the opening and insufflation of the abdomen was begun.  A 5-mm port was then placed in the patient's epigastrium and 2 more in the right upper quadrant under direct vision.  The patient's gallbladder was found to be chronically scarred and covered with omentum.  I was able to peel the omentum down and then elevate the gallbladder.  I did needle aspirate some bile in order to facilitate grasping it  better.  I was then able to grasp and retract above the liver bed.  I then dissected out the base of the gallbladder.  The gallbladder was quite inflamed and hardened.  I was able to finally dissect out the cystic duct and cystic artery and achieved critical window of both.  They were almost fused together.  I could actually see the common bile duct and where it curved back into the liver.  I placed 1 clip on the cystic duct and cystic artery and then transected it distal to this.  I was then able to slip another clip around the end of the cystic duct and then used a 0 Vicryl endo-loop to close off the thickened cystic stump.  Good closure appeared to be achieved.  I then slowly dissected free the gallbladder from the liver bed with the electrocautery.  Once this was free from liver bed, I placed an Endosac and removed it through the incision at the umbilicus. Again, I examined liver bed, hemostasis felt to be achieved.  I tied off with 0 Vicryl at the umbilicus closing the fascial defect.  I then copiously irrigated the abdomen with normal saline.  Again, hemostasis appeared to be achieved.  At this point, all ports removed under direct vision and the abdomen was deflated.  All  incisions were then anesthetized with Marcaine and closed with 4-0 Monocryl subcuticular sutures.  Skin glue was then applied.  The patient tolerated the procedure well.  All the counts were correct at the end of procedure.  The patient was then extubated in operating room and taken in stable condition to recovery room.     Abigail Miyamoto, M.D.     DB/MEDQ  D:  04/17/2015  T:  04/17/2015  Job:  [id number]

## 2015-04-17 NOTE — Anesthesia Procedure Notes (Signed)
Procedure Name: Intubation Date/Time: 04/17/2015 9:51 AM Performed by: Orest Dikes Pre-anesthesia Checklist: Patient identified, Emergency Drugs available, Suction available and Patient being monitored Patient Re-evaluated:Patient Re-evaluated prior to inductionOxygen Delivery Method: Circle System Utilized Preoxygenation: Pre-oxygenation with 100% oxygen Intubation Type: IV induction Ventilation: Mask ventilation without difficulty Laryngoscope Size: Mac and 4 Grade View: Grade I Tube type: Oral Number of attempts: 1 Airway Equipment and Method: Stylet,  Oral airway and Video-laryngoscopy Placement Confirmation: ETT inserted through vocal cords under direct vision,  positive ETCO2 and breath sounds checked- equal and bilateral Secured at: 21 cm Tube secured with: Tape Dental Injury: Teeth and Oropharynx as per pre-operative assessment

## 2015-04-17 NOTE — Interval H&P Note (Signed)
History and Physical Interval Note:no change in H and P  04/17/2015 7:54 AM  Berniece Pap  has presented today for surgery, with the diagnosis of SYMPTOMATIC GALLSTONES  The various methods of treatment have been discussed with the patient and family. After consideration of risks, benefits and other options for treatment, the patient has consented to  Procedure(s): LAPAROSCOPIC CHOLECYSTECTOMY (N/A) as a surgical intervention .  The patient's history has been reviewed, patient examined, no change in status, stable for surgery.  I have reviewed the patient's chart and labs.  Questions were answered to the patient's satisfaction.     Gael Londo A

## 2015-04-17 NOTE — Anesthesia Postprocedure Evaluation (Signed)
Anesthesia Post Note  Patient: Neil Griffith  Procedure(s) Performed: Procedure(s) (LRB): LAPAROSCOPIC CHOLECYSTECTOMY (N/A)  Patient location during evaluation: PACU Anesthesia Type: General Level of consciousness: awake and alert Pain management: pain level controlled Vital Signs Assessment: post-procedure vital signs reviewed and stable Respiratory status: spontaneous breathing Cardiovascular status: blood pressure returned to baseline Anesthetic complications: no    Last Vitals:  Filed Vitals:   04/17/15 1208 04/17/15 1315  BP: 130/73 122/67  Pulse: 62 82  Temp: 36.7 C 36.7 C  Resp: 14 16    Last Pain:  Filed Vitals:   04/17/15 1324  PainSc: 4                  Kennieth Rad

## 2015-04-22 ENCOUNTER — Encounter (HOSPITAL_COMMUNITY): Payer: Self-pay | Admitting: Surgery

## 2015-07-24 DIAGNOSIS — F609 Personality disorder, unspecified: Secondary | ICD-10-CM | POA: Diagnosis not present

## 2015-07-24 DIAGNOSIS — E781 Pure hyperglyceridemia: Secondary | ICD-10-CM | POA: Diagnosis not present

## 2015-07-24 DIAGNOSIS — K219 Gastro-esophageal reflux disease without esophagitis: Secondary | ICD-10-CM | POA: Diagnosis not present

## 2015-07-24 DIAGNOSIS — E782 Mixed hyperlipidemia: Secondary | ICD-10-CM | POA: Diagnosis not present

## 2015-07-24 DIAGNOSIS — I10 Essential (primary) hypertension: Secondary | ICD-10-CM | POA: Diagnosis not present

## 2015-09-26 DIAGNOSIS — M545 Low back pain: Secondary | ICD-10-CM | POA: Diagnosis not present

## 2015-09-30 DIAGNOSIS — B029 Zoster without complications: Secondary | ICD-10-CM | POA: Diagnosis not present

## 2016-07-15 DIAGNOSIS — K219 Gastro-esophageal reflux disease without esophagitis: Secondary | ICD-10-CM | POA: Diagnosis not present

## 2016-07-15 DIAGNOSIS — I10 Essential (primary) hypertension: Secondary | ICD-10-CM | POA: Diagnosis not present

## 2016-07-15 DIAGNOSIS — E782 Mixed hyperlipidemia: Secondary | ICD-10-CM | POA: Diagnosis not present

## 2016-10-25 IMAGING — CT CT ABD-PELV W/ CM
1 of 2 series · 15 of 32 positions shown, 19 images · IV contrast (OMNIPAQUE 300)
Comparison: None.

CLINICAL DATA: Right lower quadrant pain beginning this morning.

EXAM:
CT ABDOMEN AND PELVIS WITH CONTRAST
TECHNIQUE: Multidetector CT imaging of the abdomen and pelvis was performed
using the standard protocol following bolus administration of
intravenous contrast.
CONTRAST:  50mL OMNIPAQUE IOHEXOL 300 MG/ML SOLN, 100mL OMNIPAQUE
IOHEXOL 300 MG/ML SOLN

[Series 2: abd/pel with · axial · 0.76mm/px · z∈[-528,-58]mm · 15 of 104 slices shown, 19 images]
[im 5/104  soft-tissue]
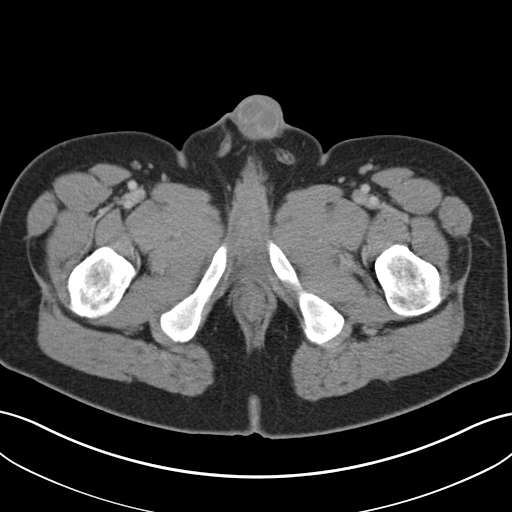
[im 5/104  bone]
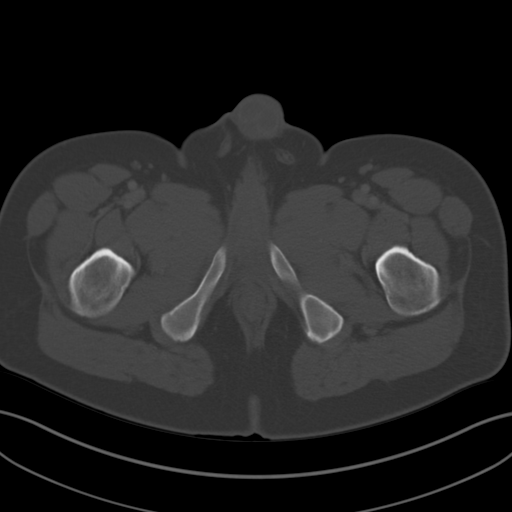
[im 13/104  soft-tissue]
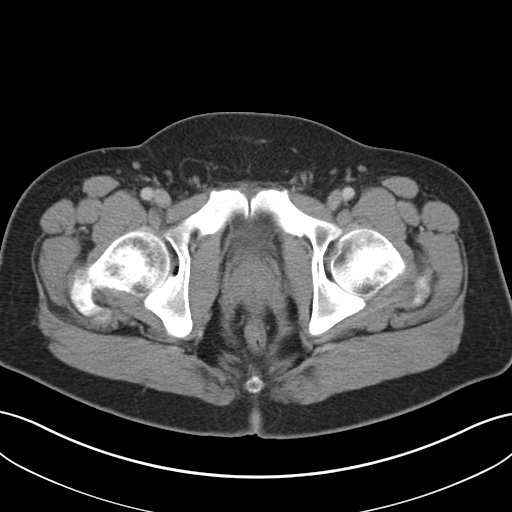
[im 22/104  soft-tissue]
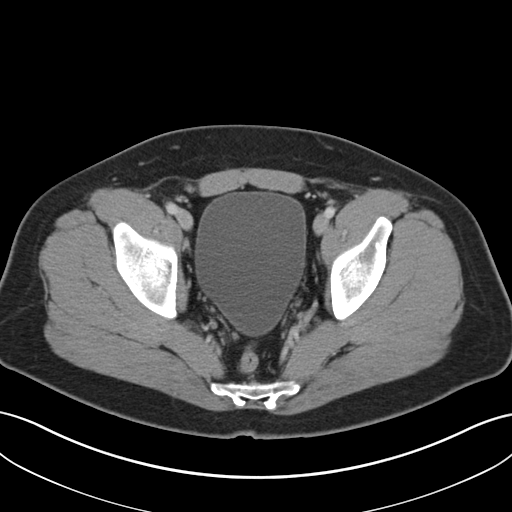
[im 31/104  soft-tissue]
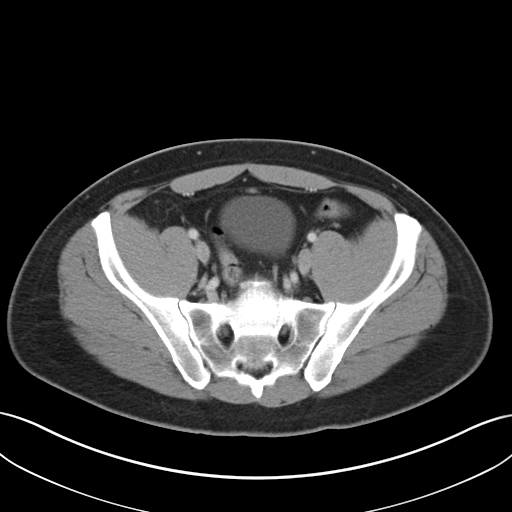
[im 35/104  soft-tissue]
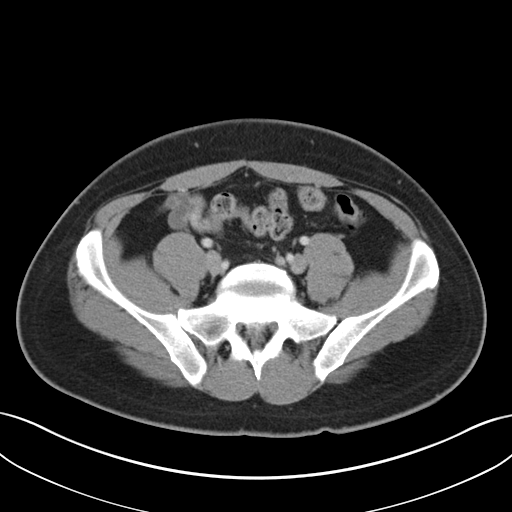
[im 43/104  soft-tissue]
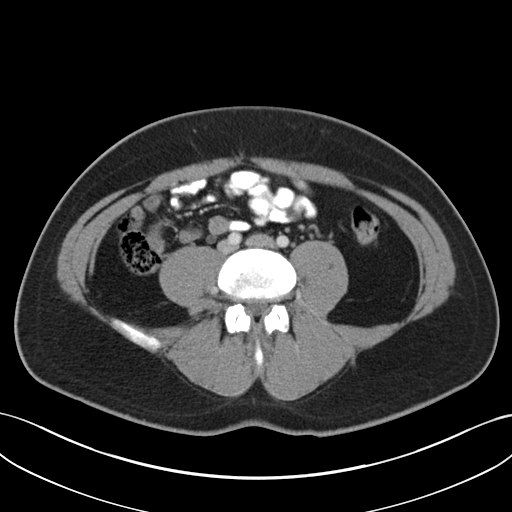
[im 52/104  soft-tissue]
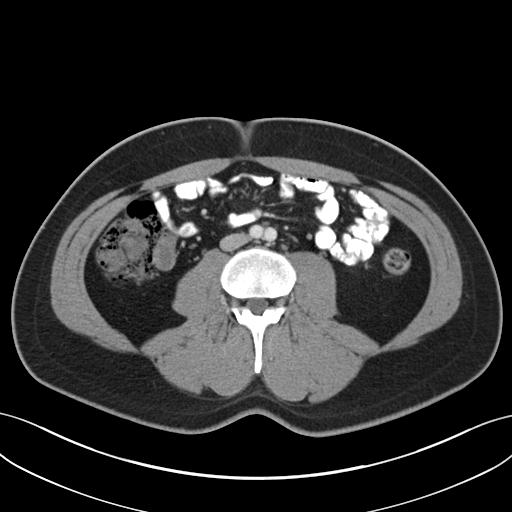
[im 61/104  soft-tissue]
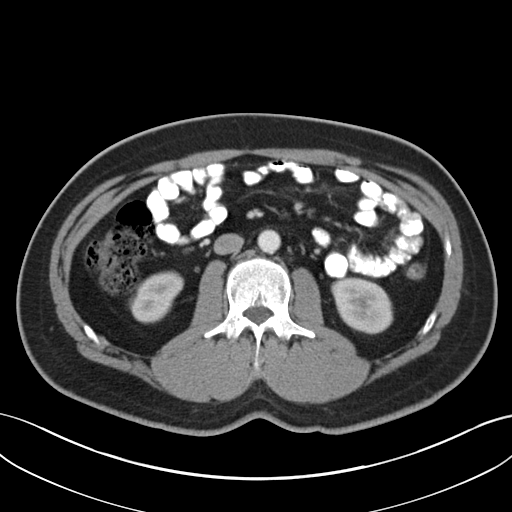
[im 69/104  soft-tissue]
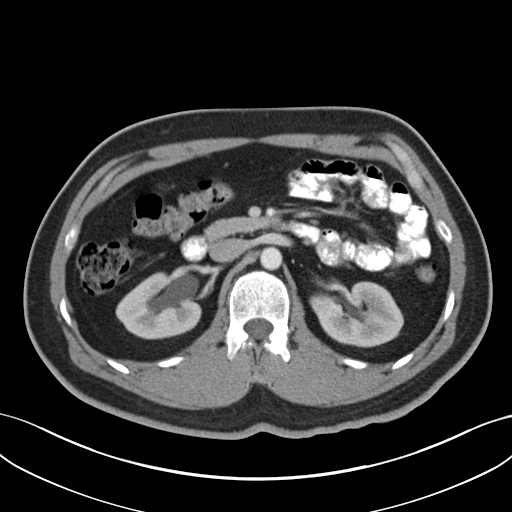
[im 69/104  bone]
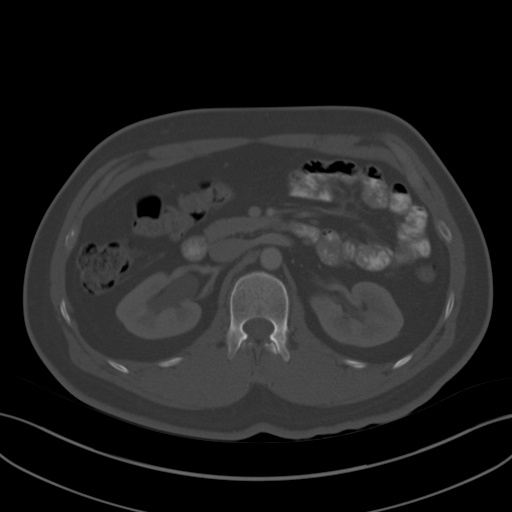
[im 73/104  soft-tissue]
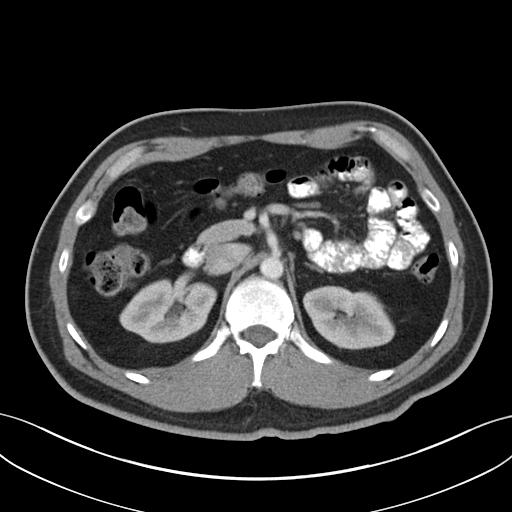
[im 82/104  soft-tissue]
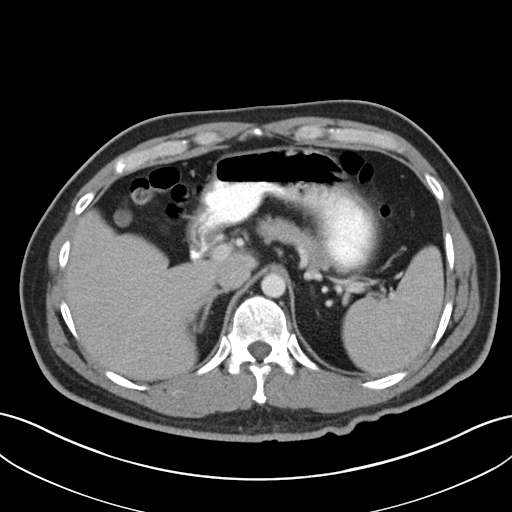
[im 86/104  lung]
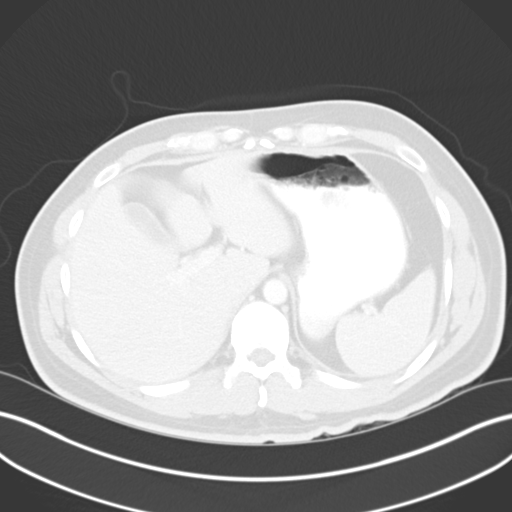
[im 91/104  soft-tissue]
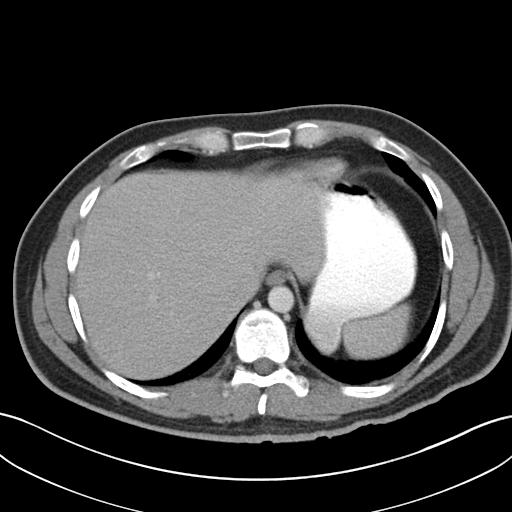
[im 91/104  lung]
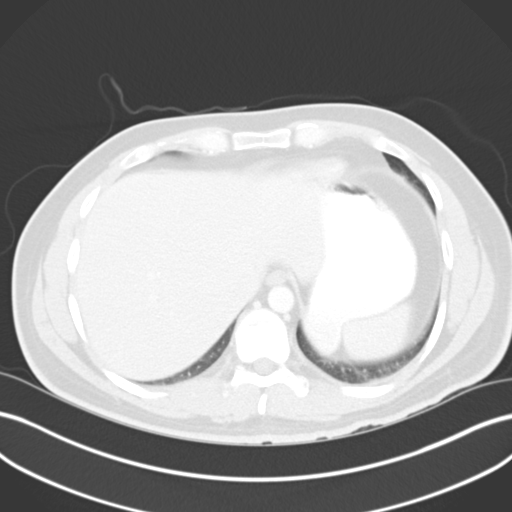
[im 95/104  lung]
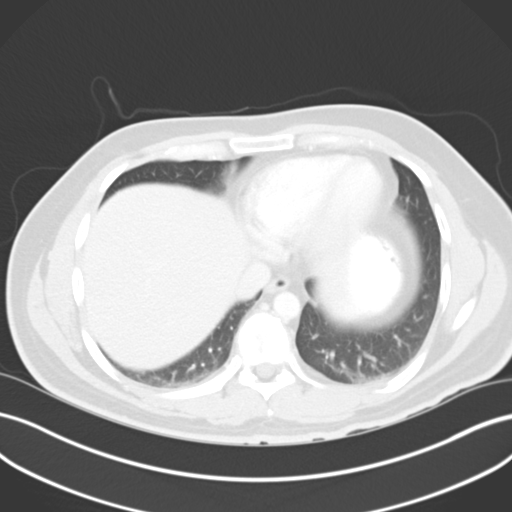
[im 99/104  soft-tissue]
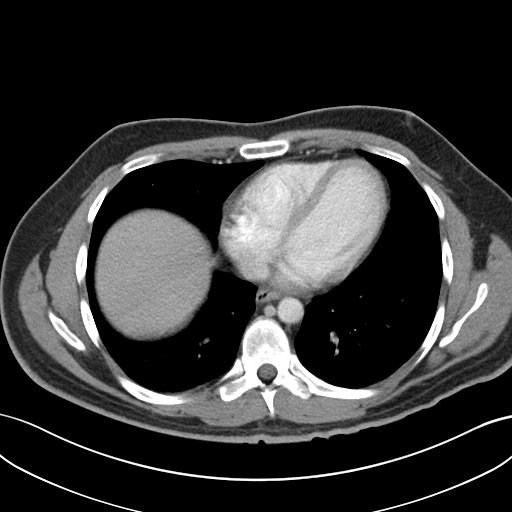
[im 99/104  lung]
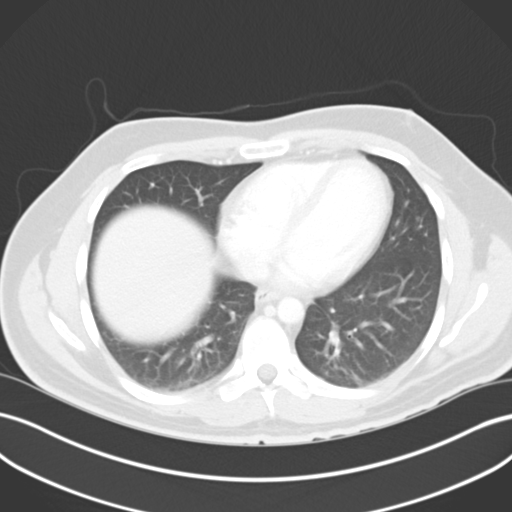

[15 of 32 positions shown; findings below may reference images not displayed]

FINDINGS: Lung bases are unremarkable.

Abdominal images demonstrate a normal liver, spleen, pancreas,
gallbladder and adrenal glands. Appendix is somewhat short but
otherwise within normal. Vascular structures are normal. Small bowel
and colon are within normal.

Kidneys are normal in size without evidence of nephrolithiasis. Mild
prominence of the right intrarenal collecting system. No perinephric
inflammation or fluid. No evidence of ureteral stones or bladder
stone.

Pelvic images demonstrate a normal bladder, prostate and rectum.
Remaining bones and soft tissues are within normal.
IMPRESSION: Minimal dilatation of the right intrarenal collecting system.
Findings may be within normal although could be due to recent
passage of a stone. Otherwise, no acute findings in the
abdomen/pelvis.

## 2017-08-25 DIAGNOSIS — E782 Mixed hyperlipidemia: Secondary | ICD-10-CM | POA: Diagnosis not present

## 2017-08-25 DIAGNOSIS — I10 Essential (primary) hypertension: Secondary | ICD-10-CM | POA: Diagnosis not present

## 2017-08-25 DIAGNOSIS — Z6831 Body mass index (BMI) 31.0-31.9, adult: Secondary | ICD-10-CM | POA: Diagnosis not present

## 2017-08-25 DIAGNOSIS — K219 Gastro-esophageal reflux disease without esophagitis: Secondary | ICD-10-CM | POA: Diagnosis not present

## 2017-12-22 DIAGNOSIS — Z23 Encounter for immunization: Secondary | ICD-10-CM | POA: Diagnosis not present

## 2018-09-16 DIAGNOSIS — R599 Enlarged lymph nodes, unspecified: Secondary | ICD-10-CM | POA: Diagnosis not present

## 2019-06-28 DIAGNOSIS — Z23 Encounter for immunization: Secondary | ICD-10-CM | POA: Diagnosis not present

## 2019-07-19 DIAGNOSIS — Z23 Encounter for immunization: Secondary | ICD-10-CM | POA: Diagnosis not present

## 2019-12-28 DIAGNOSIS — Z23 Encounter for immunization: Secondary | ICD-10-CM | POA: Diagnosis not present

## 2020-02-12 DIAGNOSIS — Z23 Encounter for immunization: Secondary | ICD-10-CM | POA: Diagnosis not present

## 2021-11-27 ENCOUNTER — Encounter (HOSPITAL_BASED_OUTPATIENT_CLINIC_OR_DEPARTMENT_OTHER): Payer: Self-pay

## 2021-11-27 ENCOUNTER — Emergency Department (HOSPITAL_BASED_OUTPATIENT_CLINIC_OR_DEPARTMENT_OTHER)
Admission: EM | Admit: 2021-11-27 | Discharge: 2021-11-27 | Disposition: A | Discharge status: Home / Self Care | Payer: Medicare Other | Attending: Emergency Medicine | Admitting: Emergency Medicine

## 2021-11-27 ENCOUNTER — Other Ambulatory Visit: Payer: Self-pay

## 2021-11-27 DIAGNOSIS — Z79899 Other long term (current) drug therapy: Secondary | ICD-10-CM | POA: Diagnosis not present

## 2021-11-27 DIAGNOSIS — S39012A Strain of muscle, fascia and tendon of lower back, initial encounter: Secondary | ICD-10-CM | POA: Insufficient documentation

## 2021-11-27 DIAGNOSIS — X58XXXA Exposure to other specified factors, initial encounter: Secondary | ICD-10-CM | POA: Diagnosis not present

## 2021-11-27 DIAGNOSIS — S3992XA Unspecified injury of lower back, initial encounter: Secondary | ICD-10-CM | POA: Diagnosis present

## 2021-11-27 MED ORDER — NAPROXEN 250 MG PO TABS
500.0000 mg | ORAL_TABLET | Freq: Once | ORAL | Status: AC
  Administered 2021-11-27: 500 mg via ORAL
  Filled 2021-11-27: qty 2

## 2021-11-27 MED ORDER — NAPROXEN 500 MG PO TABS: 500.0000 mg | ORAL_TABLET | Freq: Two times a day (BID) | ORAL | 0 refills | Status: AC

## 2021-11-27 MED ORDER — CYCLOBENZAPRINE HCL 10 MG PO TABS: 10.0000 mg | ORAL_TABLET | Freq: Two times a day (BID) | ORAL | 0 refills | Status: AC | PRN

## 2021-11-27 NOTE — ED Triage Notes (Signed)
Pt states that he bent over a few days ago and started experiencing pain in the lumbar region of his back. No relief with heat/ice nor tylenol.

## 2021-11-27 NOTE — Discharge Instructions (Signed)
Take lesions as prescribed to help with the pain and discomfort.  Follow-up with a primary care doctor to be rechecked if the symptoms are not improving in the next week.  Consider massage therapy to help with your symptoms.

## 2021-11-27 NOTE — ED Provider Notes (Signed)
[facility] EMERGENCY DEPT Provider Note   CSN: [id number] Arrival date & time: 11/27/21  1237     History  Chief Complaint  Patient presents with   Back Pain    Neil Griffith is a 41 y.o. male.   Back Pain  Patient has history of hypertension prior back pain, reflux who presents to the ED with complaints of a back injury.  Patient states a few days ago he bent over to pick something up.  He started experiencing pain in his lower back.  Since that time he has had persistent pain in the lower back region.  He has tried heat and ice without much relief.  He has taken Tylenol which helps somewhat but the pain returns.  He denies any numbness or weakness.  No bowel or bladder incontinence.  No fevers.  No falls.    Home Medications Prior to Admission medications   Medication Sig Start Date End Date Taking? Authorizing Provider  cyclobenzaprine (FLEXERIL) 10 MG tablet Take 1 tablet (10 mg total) by mouth 2 (two) times daily as needed for muscle spasms. 11/27/21  Yes Linwood Dibbles, MD  naproxen (NAPROSYN) 500 MG tablet Take 1 tablet (500 mg total) by mouth 2 (two) times daily with a meal. As needed for pain 11/27/21  Yes Linwood Dibbles, MD  lisinopril (PRINIVIL,ZESTRIL) 20 MG tablet Take 20 mg by mouth daily.    [provider]  omeprazole (PRILOSEC) 40 MG capsule Take 40 mg by mouth daily.    [provider]  oxyCODONE-acetaminophen (PERCOCET/ROXICET) 5-325 MG tablet Take 1-2 tablets by mouth every 4 (four) hours as needed for severe pain. 04/17/15   Abigail Miyamoto, MD      Allergies    Patient has no known allergies.    Review of Systems   Review of Systems  Musculoskeletal:  Positive for back pain.    Physical Exam Updated Vital Signs BP (!) 150/98 (BP Location: Right Arm)   Pulse 82   Temp 98.7 F (37.1 C) (Oral)   Resp 16   Ht 1.803 m (5\' 11" )   Wt 111.1 kg   SpO2 97%   BMI 34.17 kg/m  Physical Exam Vitals and nursing note reviewed.   Constitutional:      General: He is not in acute distress.    Appearance: He is well-developed.  HENT:     Head: Normocephalic and atraumatic.     Right Ear: External ear normal.     Left Ear: External ear normal.  Eyes:     General: No scleral icterus.       Right eye: No discharge.        Left eye: No discharge.     Conjunctiva/sclera: Conjunctivae normal.  Neck:     Trachea: No tracheal deviation.  Cardiovascular:     Rate and Rhythm: Normal rate.  Pulmonary:     Effort: Pulmonary effort is normal. No respiratory distress.     Breath sounds: No stridor.  Abdominal:     General: There is no distension.  Musculoskeletal:        General: No swelling or deformity.     Cervical back: Neck supple.     Lumbar back: Tenderness present. No swelling.     Comments: Extremities are warm and well perfused  Skin:    General: Skin is warm and dry.     Findings: No rash.  Neurological:     Mental Status: He is alert.     Cranial  Nerves: Cranial nerve deficit: no gross deficits.     Comments: Normal strength and sensation bilateral lower extremities     ED Results / Procedures / Treatments   Labs (all labs ordered are listed, but only abnormal results are displayed) Labs Reviewed - No data to display  EKG None  Radiology No results found.  Procedures Procedures    Medications Ordered in ED Medications  naproxen (NAPROSYN) tablet 500 mg (has no administration in time range)    ED Course/ Medical Decision Making/ A&P                           Medical Decision Making Problems Addressed: Strain of lumbar region, initial encounter: acute illness or injury  Risk Prescription drug management.   No sign of acute neurological or vascular emergency associated with pt's back pain.  Evaluation and diagnostic testing in the emergency department does not suggest an emergent condition requiring admission or immediate intervention beyond what has been performed at this time.   The patient is safe for discharge and has been instructed to return immediately for worsening symptoms, change in symptoms or any other concerns.        Final Clinical Impression(s) / ED Diagnoses Final diagnoses:  Strain of lumbar region, initial encounter    Rx / DC Orders ED Discharge Orders          Ordered    cyclobenzaprine (FLEXERIL) 10 MG tablet  2 times daily PRN        11/27/21 1258    naproxen (NAPROSYN) 500 MG tablet  2 times daily with meals        11/27/21 1258              Dorie Rank, MD 11/27/21 1301
